# Patient Record
Sex: Female | Born: 1975
Health system: Southern US, Community
[De-identification: ages and names within clinical notes are randomized; demographics above are authoritative.]

## PROBLEM LIST (undated history)

## (undated) DIAGNOSIS — Z87442 Personal history of urinary calculi: Secondary | ICD-10-CM

## (undated) DIAGNOSIS — Z860100 Personal history of colon polyps, unspecified: Secondary | ICD-10-CM

## (undated) DIAGNOSIS — K859 Acute pancreatitis without necrosis or infection, unspecified: Secondary | ICD-10-CM

## (undated) DIAGNOSIS — E669 Obesity, unspecified: Secondary | ICD-10-CM

## (undated) DIAGNOSIS — G971 Other reaction to spinal and lumbar puncture: Secondary | ICD-10-CM

## (undated) DIAGNOSIS — G039 Meningitis, unspecified: Secondary | ICD-10-CM

## (undated) DIAGNOSIS — A6000 Herpesviral infection of urogenital system, unspecified: Secondary | ICD-10-CM

## (undated) DIAGNOSIS — F40243 Fear of flying: Secondary | ICD-10-CM

## (undated) HISTORY — DX: Personal history of colon polyps, unspecified: Z86.0100

## (undated) HISTORY — DX: Fear of flying: F40.243

## (undated) HISTORY — PX: OTHER SURGICAL HISTORY: SHX169

## (undated) HISTORY — DX: Personal history of urinary calculi: Z87.442

## (undated) HISTORY — DX: Other reaction to spinal and lumbar puncture: G97.1

## (undated) HISTORY — DX: Acute pancreatitis without necrosis or infection, unspecified: K85.90

## (undated) HISTORY — PX: TONSILLECTOMY: SUR1361

## (undated) HISTORY — PX: KNEE ARTHROSCOPY: SUR90

## (undated) HISTORY — DX: Herpesviral infection of urogenital system, unspecified: A60.00

## (undated) HISTORY — PX: ECTOPIC PREGNANCY SURGERY: SHX613

## (undated) HISTORY — PX: CHOLECYSTECTOMY: SHX55

## (undated) HISTORY — DX: Obesity, unspecified: E66.9

## (undated) HISTORY — PX: TUBAL LIGATION: SHX77

## (undated) HISTORY — DX: Personal history of colonic polyps: Z86.010

## (undated) HISTORY — DX: Meningitis, unspecified: G03.9

## (undated) HISTORY — PX: ABDOMINAL HYSTERECTOMY: SHX81

---

## 2008-10-16 ENCOUNTER — Emergency Department (HOSPITAL_BASED_OUTPATIENT_CLINIC_OR_DEPARTMENT_OTHER): Admission: EM | Admit: 2008-10-16 | Discharge: 2008-10-16 | Payer: Self-pay | Admitting: Emergency Medicine

## 2008-11-01 ENCOUNTER — Ambulatory Visit: Payer: Self-pay | Admitting: Internal Medicine

## 2008-11-01 LAB — CONVERTED CEMR LAB
HDL: 30.9 mg/dL — ABNORMAL LOW (ref >39.0)
TSH: 1.76 microintl units/mL (ref 0.35–5.50)

## 2008-11-16 ENCOUNTER — Ambulatory Visit: Payer: Self-pay | Admitting: Internal Medicine

## 2008-11-16 ENCOUNTER — Ambulatory Visit: Payer: Self-pay

## 2008-11-16 ENCOUNTER — Encounter: Payer: Self-pay | Admitting: Internal Medicine

## 2009-01-25 ENCOUNTER — Ambulatory Visit: Payer: Self-pay

## 2009-01-25 ENCOUNTER — Encounter: Payer: Self-pay | Admitting: Internal Medicine

## 2009-04-08 ENCOUNTER — Emergency Department (HOSPITAL_BASED_OUTPATIENT_CLINIC_OR_DEPARTMENT_OTHER): Admission: EM | Admit: 2009-04-08 | Discharge: 2009-04-08 | Payer: Self-pay | Admitting: Emergency Medicine

## 2009-04-08 ENCOUNTER — Ambulatory Visit: Payer: Self-pay | Admitting: Diagnostic Radiology

## 2009-04-10 ENCOUNTER — Emergency Department (HOSPITAL_BASED_OUTPATIENT_CLINIC_OR_DEPARTMENT_OTHER): Admission: EM | Admit: 2009-04-10 | Discharge: 2009-04-10 | Payer: Self-pay | Admitting: Emergency Medicine

## 2009-06-26 ENCOUNTER — Ambulatory Visit (HOSPITAL_COMMUNITY): Payer: Self-pay | Admitting: Psychiatry

## 2009-08-09 ENCOUNTER — Ambulatory Visit (HOSPITAL_COMMUNITY): Payer: Self-pay | Admitting: Psychiatry

## 2009-11-06 ENCOUNTER — Ambulatory Visit (HOSPITAL_COMMUNITY): Payer: Self-pay | Admitting: Psychiatry

## 2010-02-04 ENCOUNTER — Ambulatory Visit (HOSPITAL_COMMUNITY): Payer: Self-pay | Admitting: Psychiatry

## 2010-12-31 ENCOUNTER — Emergency Department (HOSPITAL_BASED_OUTPATIENT_CLINIC_OR_DEPARTMENT_OTHER)
Admission: EM | Admit: 2010-12-31 | Discharge: 2010-12-31 | Payer: Self-pay | Source: Home / Self Care | Admitting: Emergency Medicine

## 2010-12-31 LAB — COMPREHENSIVE METABOLIC PANEL
ALT: 30 U/L (ref 0–35)
AST: 24 U/L (ref 0–37)
Albumin: 4.4 g/dL (ref 3.5–5.2)
Alkaline Phosphatase: 109 U/L (ref 39–117)
BUN: 7 mg/dL (ref 6–23)
CO2: 21 mEq/L (ref 19–32)
Calcium: 9.2 mg/dL (ref 8.4–10.5)
Chloride: 109 mEq/L (ref 96–112)
Creatinine, Ser: 0.8 mg/dL (ref 0.4–1.2)
GFR calc Af Amer: >60 mL/min (ref >60)
GFR calc non Af Amer: >60 mL/min (ref >60)
Glucose, Bld: 89 mg/dL (ref 70–99)
Potassium: 4 mEq/L (ref 3.5–5.1)
Sodium: 142 mEq/L (ref 135–145)
Total Bilirubin: 0.7 mg/dL (ref 0.3–1.2)
Total Protein: 7.9 g/dL (ref 6.0–8.3)

## 2010-12-31 LAB — CBC
HCT: 40.4 % (ref 36.0–46.0)
Hemoglobin: 13.6 g/dL (ref 12.0–15.0)
MCH: 28.4 pg (ref 26.0–34.0)
MCHC: 33.7 g/dL (ref 30.0–36.0)
MCV: 84.3 fL (ref 78.0–100.0)
Platelets: 275 10*3/uL (ref 150–400)
RBC: 4.79 MIL/uL (ref 3.87–5.11)
RDW: 14.1 % (ref 11.5–15.5)
WBC: 9.7 10*3/uL (ref 4.0–10.5)

## 2010-12-31 LAB — WET PREP, GENITAL
Trich, Wet Prep: NONE SEEN
Yeast Wet Prep HPF POC: NONE SEEN

## 2010-12-31 LAB — URINALYSIS, ROUTINE W REFLEX MICROSCOPIC
Bilirubin Urine: NEGATIVE
Hemoglobin, Urine: NEGATIVE
Ketones, ur: 15 mg/dL — AB
Nitrite: NEGATIVE
Protein, ur: NEGATIVE mg/dL
Specific Gravity, Urine: 1.007 (ref 1.005–1.030)
Urine Glucose, Fasting: NEGATIVE mg/dL
Urobilinogen, UA: 0.2 mg/dL (ref 0.0–1.0)
pH: 5.5 (ref 5.0–8.0)

## 2010-12-31 LAB — PREGNANCY, URINE: Preg Test, Ur: NEGATIVE

## 2011-01-01 LAB — DIFFERENTIAL
Basophils Absolute: 0 10*3/uL (ref 0.0–0.1)
Basophils Relative: 0 % (ref 0–1)
Eosinophils Absolute: 0.1 10*3/uL (ref 0.0–0.7)
Eosinophils Relative: 1 % (ref 0–5)
Lymphocytes Relative: 26 % (ref 12–46)
Lymphs Abs: 2.5 10*3/uL (ref 0.7–4.0)
Monocytes Absolute: 0.5 10*3/uL (ref 0.1–1.0)
Monocytes Relative: 5 % (ref 3–12)
Neutro Abs: 6.6 10*3/uL (ref 1.7–7.7)
Neutrophils Relative %: 68 % (ref 43–77)

## 2011-01-01 LAB — GC/CHLAMYDIA PROBE AMP, GENITAL
Chlamydia, DNA Probe: NEGATIVE
GC Probe Amp, Genital: NEGATIVE

## 2011-04-08 LAB — COMPREHENSIVE METABOLIC PANEL
ALT: 51 U/L — ABNORMAL HIGH (ref 0–35)
AST: 32 U/L (ref 0–37)
Albumin: 4.2 g/dL (ref 3.5–5.2)
Alkaline Phosphatase: 97 U/L (ref 39–117)
BUN: 6 mg/dL (ref 6–23)
CO2: 27 mEq/L (ref 19–32)
Calcium: 9.3 mg/dL (ref 8.4–10.5)
Chloride: 104 mEq/L (ref 96–112)
Creatinine, Ser: 0.8 mg/dL (ref 0.4–1.2)
GFR calc Af Amer: >60 mL/min (ref >60)
GFR calc non Af Amer: >60 mL/min (ref >60)
Glucose, Bld: 88 mg/dL (ref 70–99)
Potassium: 3.7 mEq/L (ref 3.5–5.1)
Sodium: 141 mEq/L (ref 135–145)
Total Bilirubin: 0.7 mg/dL (ref 0.3–1.2)
Total Protein: 7.3 g/dL (ref 6.0–8.3)

## 2011-04-08 LAB — BASIC METABOLIC PANEL
BUN: 5 mg/dL — ABNORMAL LOW (ref 6–23)
CO2: 28 mEq/L (ref 19–32)
Calcium: 9.2 mg/dL (ref 8.4–10.5)
Chloride: 104 mEq/L (ref 96–112)
Creatinine, Ser: 0.9 mg/dL (ref 0.4–1.2)
GFR calc Af Amer: >60 mL/min (ref >60)
GFR calc non Af Amer: >60 mL/min (ref >60)
Glucose, Bld: 85 mg/dL (ref 70–99)
Potassium: 3.8 mEq/L (ref 3.5–5.1)
Sodium: 143 mEq/L (ref 135–145)

## 2011-04-08 LAB — URINALYSIS, ROUTINE W REFLEX MICROSCOPIC
Bilirubin Urine: NEGATIVE
Glucose, UA: NEGATIVE mg/dL
Hgb urine dipstick: NEGATIVE
Hgb urine dipstick: NEGATIVE
Ketones, ur: NEGATIVE mg/dL
Nitrite: NEGATIVE
Nitrite: NEGATIVE
Protein, ur: NEGATIVE mg/dL
Specific Gravity, Urine: 1.005 (ref 1.005–1.030)
Specific Gravity, Urine: 1.015 (ref 1.005–1.030)
Urobilinogen, UA: 0.2 mg/dL (ref 0.0–1.0)
Urobilinogen, UA: 0.2 mg/dL (ref 0.0–1.0)
pH: 5.5 (ref 5.0–8.0)

## 2011-04-08 LAB — CBC
HCT: 39.2 % (ref 36.0–46.0)
HCT: 40.1 % (ref 36.0–46.0)
Hemoglobin: 13.7 g/dL (ref 12.0–15.0)
Hemoglobin: 14.1 g/dL (ref 12.0–15.0)
MCHC: 34.9 g/dL (ref 30.0–36.0)
MCHC: 35.1 g/dL (ref 30.0–36.0)
MCV: 91.1 fL (ref 78.0–100.0)
MCV: 91.8 fL (ref 78.0–100.0)
Platelets: 219 10*3/uL (ref 150–400)
Platelets: 223 10*3/uL (ref 150–400)
RBC: 4.27 MIL/uL (ref 3.87–5.11)
RBC: 4.4 MIL/uL (ref 3.87–5.11)
RDW: 12.7 % (ref 11.5–15.5)
RDW: 12.9 % (ref 11.5–15.5)
WBC: 6.3 10*3/uL (ref 4.0–10.5)
WBC: 6.5 10*3/uL (ref 4.0–10.5)

## 2011-04-08 LAB — PREGNANCY, URINE
Preg Test, Ur: NEGATIVE
Preg Test, Ur: NEGATIVE

## 2011-04-08 LAB — URINE MICROSCOPIC-ADD ON

## 2011-04-08 LAB — DIFFERENTIAL
Basophils Absolute: 0 10*3/uL (ref 0.0–0.1)
Basophils Absolute: 0 10*3/uL (ref 0.0–0.1)
Basophils Relative: 0 % (ref 0–1)
Basophils Relative: 1 % (ref 0–1)
Eosinophils Absolute: 0 10*3/uL (ref 0.0–0.7)
Eosinophils Absolute: 0.1 10*3/uL (ref 0.0–0.7)
Eosinophils Relative: 1 % (ref 0–5)
Eosinophils Relative: 1 % (ref 0–5)
Lymphocytes Relative: 28 % (ref 12–46)
Lymphocytes Relative: 31 % (ref 12–46)
Lymphs Abs: 1.8 10*3/uL (ref 0.7–4.0)
Lymphs Abs: 2 10*3/uL (ref 0.7–4.0)
Monocytes Absolute: 0.4 10*3/uL (ref 0.1–1.0)
Monocytes Absolute: 0.4 10*3/uL (ref 0.1–1.0)
Monocytes Relative: 6 % (ref 3–12)
Monocytes Relative: 7 % (ref 3–12)
Neutro Abs: 3.8 10*3/uL (ref 1.7–7.7)
Neutro Abs: 4.3 10*3/uL (ref 1.7–7.7)
Neutrophils Relative %: 60 % (ref 43–77)
Neutrophils Relative %: 65 % (ref 43–77)

## 2011-04-08 LAB — WET PREP, GENITAL: Clue Cells Wet Prep HPF POC: NONE SEEN

## 2011-04-08 LAB — LIPASE, BLOOD: Lipase: 95 U/L (ref 23–300)

## 2011-05-12 NOTE — Assessment & Plan Note (Signed)
[facility]                            CARDIOLOGY OFFICE NOTE   Laurie [name]                     MRN:          [id number]  DATE:11/01/2008                            DOB:          1976/04/12    IDENTIFICATION:  Laurie [name] is a 35 year old woman who is referred  from the emergency room for continued evaluation of chest pain.   HISTORY OF PRESENT ILLNESS:  The patient developed chest pain on October 16, 2008.  She was seen in the [facility] Emergency Room.  She describes  the pain as lasting about 2 hours before she came to the emergency room,  it was sharp on the left sided, worse with inspiration and radiated down  her arm with numbness.  Note, also moving her right arm, made it worse.  She has not had previously.  Noted some mild shortness of breath with  this.   The patient underwent CT angiography, which was negative.  Blood work  was negative for cardiac damage and she was sent home.   Since being sent home, she notes only occasional episodes of pain, but  not like which she had had.  More problematic as she said she is short  of breath.  Activity, she can do 6 months ago, she is having a hard time  doing now.  She is just fatigued.  She does note that in September, she  had flu-like symptoms and seemed like she has not recovered.   ALLERGIES:  None.   CURRENT MEDICATIONS:  Oral contraceptives and Loestrin Fe.   PAST MEDICAL HISTORY:  Menorrhagia and seasonal allergies.   SOCIAL HISTORY:  The patient is married.  Quit tobacco in April 2009.  Smoking previously from age 20 a whole half every few days.  Drinks  about 3 beers per day.   FAMILY HISTORY:  Mother is 47 and has thyroid problems.  Father is 47,  health unknown.   REVIEW OF SYSTEMS:  All systems reviewed, negative for the above problem  except as noted above.   PHYSICAL EXAMINATION:  GENERAL:  The patient is in no acute distress.  VITAL SIGNS:  Blood pressure  130/86, pulse is 100, and weight 247.  HEENT:  Normocephalic and atraumatic.  EOMI, PERL.  Mucous membranes  moist.  NECK:  JVP is normal.  The thyroid is slightly prominent.  No bruits.  LUNGS:  Clear to auscultation.  No rales or wheezes.  CARDIAC:  Regular rate and rhythm, S1 and S2.  No S3, S4, or murmurs.  ABDOMEN:  Obese, benign.  No hepatomegaly.  Normal bowel sounds.  No  masses.  EXTREMITIES:  Good distal pulses throughout.  No lower extremity edema.   A 12-lead EKG from the emergency room showed normal sinus rhythm, 84  beats per minute, T-wave inversion in III, F, V1 through V4.  A 12-lead  EKG today shows sinus rhythm, 100 beats per minute again, T-wave  inversion noted in II, III, F, V1 through V6, and poor R-wave  progression.   IMPRESSION:  Laurie [name] is a 35 year old with chest pain, atypical for  coronary seems more muscular and pleuritic.  This has not been the  biggest problem now.  She has got resultant shortness of breath.  On  exam, I do not see any evidence for heart failure.  EKG is abnormal.  I  do not have an old EKG for comparison, however.   What I would recommend for now is:  1. Checking a TSH.  2. Setting up the patient for echocardiogram.  Note, she was not      anemic when she was in the emergency room.   If the labs were normal and echo was normal, I will set her up for a  stress test as she does have risk factors for coronary artery disease.  Her EKG is abnormal.   I will be in touch with the patient and were to proceed once I have seen  the test results.   ADDENDUM:  Counseled on alcohol to cut down to 1 drink or less per day.  I congratulated on smoking cessation.     Pricilla Riffle, MD, [facility]  Electronically Signed    PVR/MedQ  DD: 11/01/2008  DT: 11/02/2008  Job #: [id number]

## 2011-09-29 LAB — POCT I-STAT, CHEM 8
Chloride: 109
HCT: 41
Hemoglobin: 13.9
Potassium: 3.7
Sodium: 138

## 2011-09-29 LAB — POCT CARDIAC MARKERS
Myoglobin, poc: 32.9
Myoglobin, poc: 40.5
Troponin i, poc: <0.05

## 2011-11-26 ENCOUNTER — Emergency Department (INDEPENDENT_AMBULATORY_CARE_PROVIDER_SITE_OTHER): Payer: Managed Care, Other (non HMO)

## 2011-11-26 ENCOUNTER — Encounter: Payer: Self-pay | Admitting: *Deleted

## 2011-11-26 ENCOUNTER — Emergency Department (HOSPITAL_BASED_OUTPATIENT_CLINIC_OR_DEPARTMENT_OTHER)
Admission: EM | Admit: 2011-11-26 | Discharge: 2011-11-26 | Disposition: A | Discharge status: Home / Self Care | Payer: Managed Care, Other (non HMO) | Attending: Emergency Medicine | Admitting: Emergency Medicine

## 2011-11-26 DIAGNOSIS — R51 Headache: Secondary | ICD-10-CM

## 2011-11-26 DIAGNOSIS — A879 Viral meningitis, unspecified: Secondary | ICD-10-CM | POA: Insufficient documentation

## 2011-11-26 DIAGNOSIS — R079 Chest pain, unspecified: Secondary | ICD-10-CM

## 2011-11-26 DIAGNOSIS — R509 Fever, unspecified: Secondary | ICD-10-CM

## 2011-11-26 LAB — BASIC METABOLIC PANEL
BUN: 11 mg/dL (ref 6–23)
CO2: 27 mEq/L (ref 19–32)
Calcium: 9.6 mg/dL (ref 8.4–10.5)
Chloride: 100 mEq/L (ref 96–112)
Creatinine, Ser: 0.8 mg/dL (ref 0.50–1.10)
GFR calc Af Amer: >90 mL/min (ref >90)
GFR calc non Af Amer: >90 mL/min (ref >90)
Glucose, Bld: 96 mg/dL (ref 70–99)
Potassium: 3.9 mEq/L (ref 3.5–5.1)
Sodium: 137 mEq/L (ref 135–145)

## 2011-11-26 LAB — DIFFERENTIAL
Eosinophils Absolute: 0.1 10*3/uL (ref 0.0–0.7)
Eosinophils Relative: 2 % (ref 0–5)
Lymphocytes Relative: 10 % — ABNORMAL LOW (ref 12–46)
Lymphs Abs: 0.7 10*3/uL (ref 0.7–4.0)
Monocytes Relative: 9 % (ref 3–12)
Neutrophils Relative %: 79 % — ABNORMAL HIGH (ref 43–77)

## 2011-11-26 LAB — CSF CELL COUNT WITH DIFFERENTIAL
RBC Count, CSF: 3 /mm3 — ABNORMAL HIGH
Tube #: 4
WBC, CSF: 1 /mm3 (ref 0–5)

## 2011-11-26 LAB — CBC
HCT: 41.8 % (ref 36.0–46.0)
Hemoglobin: 13.8 g/dL (ref 12.0–15.0)
MCH: 28.8 pg (ref 26.0–34.0)
MCHC: 33 g/dL (ref 30.0–36.0)
MCV: 87.1 fL (ref 78.0–100.0)
Platelets: 190 10*3/uL (ref 150–400)
RBC: 4.8 MIL/uL (ref 3.87–5.11)
RDW: 14.8 % (ref 11.5–15.5)
WBC: 6.7 10*3/uL (ref 4.0–10.5)

## 2011-11-26 LAB — URINALYSIS, ROUTINE W REFLEX MICROSCOPIC
Bilirubin Urine: NEGATIVE
Glucose, UA: NEGATIVE mg/dL
Hgb urine dipstick: NEGATIVE
Ketones, ur: 15 mg/dL — AB
Leukocytes, UA: NEGATIVE
Nitrite: NEGATIVE
Protein, ur: NEGATIVE mg/dL
Specific Gravity, Urine: 1.014 (ref 1.005–1.030)
Urobilinogen, UA: 0.2 mg/dL (ref 0.0–1.0)
pH: 7 (ref 5.0–8.0)

## 2011-11-26 LAB — PROTEIN AND GLUCOSE, CSF
Glucose, CSF: 61 mg/dL (ref 43–76)
Total  Protein, CSF: 24 mg/dL (ref 15–45)

## 2011-11-26 MED ORDER — CEFTRIAXONE SODIUM 2 G IJ SOLR
2.0000 g | Freq: Once | INTRAMUSCULAR | Status: AC
  Administered 2011-11-26: 2 g via INTRAVENOUS
  Filled 2011-11-26: qty 2

## 2011-11-26 MED ORDER — SODIUM CHLORIDE 0.9 % IV BOLUS (SEPSIS)
1000.0000 mL | Freq: Once | INTRAVENOUS | Status: AC
  Administered 2011-11-26: 1000 mL via INTRAVENOUS

## 2011-11-26 MED ORDER — LIDOCAINE-EPINEPHRINE 2 %-1:100000 IJ SOLN
INTRAMUSCULAR | Status: AC
  Filled 2011-11-26: qty 1

## 2011-11-26 MED ORDER — MORPHINE SULFATE 2 MG/ML IJ SOLN
2.0000 mg | Freq: Once | INTRAMUSCULAR | Status: AC
  Administered 2011-11-26: 2 mg via INTRAVENOUS
  Filled 2011-11-26: qty 1

## 2011-11-26 MED ORDER — ONDANSETRON HCL 4 MG/2ML IJ SOLN
4.0000 mg | Freq: Once | INTRAMUSCULAR | Status: AC
  Administered 2011-11-26: 4 mg via INTRAVENOUS
  Filled 2011-11-26: qty 2

## 2011-11-26 MED ORDER — HYDROMORPHONE HCL PF 2 MG/ML IJ SOLN
2.0000 mg | Freq: Once | INTRAMUSCULAR | Status: AC
  Administered 2011-11-26: 2 mg via INTRAVENOUS

## 2011-11-26 MED ORDER — OXYCODONE-ACETAMINOPHEN 5-325 MG PO TABS: 12.0000 | ORAL_TABLET | ORAL | Status: DC | PRN

## 2011-11-26 MED ORDER — LIDOCAINE-EPINEPHRINE 2 %-1:200000 IJ SOLN
20.0000 mL | Freq: Once | INTRAMUSCULAR | Status: AC
Start: 2011-11-26 — End: 2011-11-26
  Administered 2011-11-26: 20 mL

## 2011-11-26 MED ORDER — ONDANSETRON 8 MG PO TBDP: 8.0000 mg | ORAL_TABLET | Freq: Three times a day (TID) | ORAL | Status: AC | PRN

## 2011-11-26 MED ORDER — HYDROMORPHONE HCL PF 2 MG/ML IJ SOLN
INTRAMUSCULAR | Status: AC
  Administered 2011-11-26: 2 mg via INTRAVENOUS
  Filled 2011-11-26: qty 1

## 2011-11-26 NOTE — ED Provider Notes (Signed)
Signed out that if/when csf returns, and is not worrisome for bact men, that d/c instructions complete, to d/c home. Csf not c/w bact men. Recheck pt comfortable. Able to move head/neck freely without apparent pain or rigidity.   Suzi Roots, MD 11/26/11 [phone]

## 2011-11-26 NOTE — ED Notes (Signed)
Patient states on Tuesday evening with a headache.  Yesterday started to have a fever, cough, sorethroat and confusion.  Taking tylenol flu meds with minimal relief.  Was seen this morning by [facility] PCP and sent here to r/o meninginitis to r/o neck stiffness, photophobia and headache.  States she had a negative flu test.

## 2011-11-26 NOTE — ED Notes (Signed)
Pt drinking diet coke and tolerating well.

## 2011-11-26 NOTE — ED Provider Notes (Signed)
History     CSN: [id number] Arrival date & time: 11/26/2011 10:39 AM   First MD Initiated Contact with Patient 11/26/11 1110      Chief Complaint  Patient presents with  . Headache    PCP sent patient here to r/o meningitis    (Consider location/radiation/quality/duration/timing/severity/associated sxs/prior treatment) HPI  History reviewed. No pertinent past medical history.  Patient with headache began Tuesday resolved then returned yesterday.  Myalgias, fever, neck pain and spinal cord feels "hot."  Patient states seeing colors differently.  Patient states fever has been to 102.  Took tylenol at 830.  Saw her doctor at 0900.  Patient sent here for evaluation.  Pain is 6/10.  Pain is located bifrontal and back and down neck.  Patient denies sick exposures.  No flu shot but negative flu swab at her doctor's office.    Past Surgical History  Procedure Date  . Abdominal hysterectomy   . Tonsillectomy   . Cholecystectomy   . Bilateral tubal      x2  . Ectopic pregnancy surgery     No family history on file.  History  Substance Use Topics  . Smoking status: Former Games developer  . Smokeless tobacco: Not on file  . Alcohol Use: 0.0 oz/week    2-3 Cans of beer per week     daily    OB History    Grav Para Term Preterm Abortions TAB SAB Ect Mult Living                  Review of Systems  All other systems reviewed and are negative.    Allergies  Review of patient's allergies indicates no known allergies.  Home Medications   Current Outpatient Rx  Name Route Sig Dispense Refill  . ESTRADIOL 0.05 MG/24HR TD PTTW Transdermal Place 1 patch onto the skin once a week.      Marland Kitchen ESTRADIOL 0.1 MG/24HR TD PTTW Transdermal Place 1 patch onto the skin 2 (two) times a week.        BP 132/92  Pulse 125  Temp(Src) 98.3 F (36.8 C) (Oral)  Resp 20  Ht 5\' 11"  (1.803 m)  Wt 207 lb (93.895 kg)  BMI 28.87 kg/m2  SpO2 100%  Physical Exam  Nursing note and vitals  reviewed. Constitutional: She appears well-developed and well-nourished.  HENT:  Head: Normocephalic and atraumatic.  Nose: Nose normal.  Mouth/Throat: Oropharynx is clear and moist.  Eyes: Conjunctivae and EOM are normal. Pupils are equal, round, and reactive to light.  Neck: Normal range of motion. Neck supple.  Cardiovascular: Tachycardia present.   Pulmonary/Chest: Breath sounds normal. She is in respiratory distress.    ED Course  LUMBAR PUNCTURE Date/Time: 11/26/2011 3:59 PM Performed by: Hilario Quarry Authorized by: Hilario Quarry Consent: Verbal consent obtained. Written consent obtained. Risks and benefits: risks, benefits and alternatives were discussed Consent given by: patient Patient understanding: patient states understanding of the procedure being performed Patient identity confirmed: verbally with patient Time out: Immediately prior to procedure a "time out" was called to verify the correct patient, procedure, equipment, support staff and site/side marked as required. Indications: evaluation for infection Anesthesia: local infiltration Local anesthetic: lidocaine 1% with epinephrine Patient sedated: no Preparation: Patient was prepped and draped in the usual sterile fashion. Lumbar space: L4-L5 interspace Patient's position: left lateral decubitus Needle gauge: 22 Needle type: spinal needle - Quincke tip Needle length: 3.5 in Number of attempts: 1 Fluid appearance: clear Tubes of fluid:  4 Total volume: 4 ml Post-procedure: site cleaned Patient tolerance: Patient tolerated the procedure well with no immediate complications.   (including critical care time)  Labs Reviewed - No data to display No results found.   No diagnosis found.    MDM          Hilario Quarry, MD 11/27/11 [phone]

## 2011-11-30 ENCOUNTER — Encounter (HOSPITAL_BASED_OUTPATIENT_CLINIC_OR_DEPARTMENT_OTHER): Payer: Self-pay | Admitting: *Deleted

## 2011-11-30 ENCOUNTER — Emergency Department (HOSPITAL_BASED_OUTPATIENT_CLINIC_OR_DEPARTMENT_OTHER)
Admission: EM | Admit: 2011-11-30 | Discharge: 2011-11-30 | Disposition: A | Discharge status: Home / Self Care | Payer: Managed Care, Other (non HMO) | Attending: Emergency Medicine | Admitting: Emergency Medicine

## 2011-11-30 DIAGNOSIS — G971 Other reaction to spinal and lumbar puncture: Secondary | ICD-10-CM | POA: Insufficient documentation

## 2011-11-30 DIAGNOSIS — R110 Nausea: Secondary | ICD-10-CM | POA: Insufficient documentation

## 2011-11-30 DIAGNOSIS — H53149 Visual discomfort, unspecified: Secondary | ICD-10-CM | POA: Insufficient documentation

## 2011-11-30 LAB — CSF CULTURE W GRAM STAIN: Culture: NO GROWTH

## 2011-11-30 MED ORDER — KETOROLAC TROMETHAMINE 30 MG/ML IJ SOLN
30.0000 mg | Freq: Once | INTRAMUSCULAR | Status: AC
  Administered 2011-11-30: 30 mg via INTRAVENOUS
  Filled 2011-11-30: qty 1

## 2011-11-30 MED ORDER — DIPHENHYDRAMINE HCL 50 MG/ML IJ SOLN
25.0000 mg | Freq: Once | INTRAMUSCULAR | Status: AC
  Administered 2011-11-30: 25 mg via INTRAVENOUS
  Filled 2011-11-30: qty 1

## 2011-11-30 MED ORDER — SODIUM CHLORIDE 0.9 % IV BOLUS (SEPSIS)
2000.0000 mL | Freq: Once | INTRAVENOUS | Status: AC
  Administered 2011-11-30: 2000 mL via INTRAVENOUS

## 2011-11-30 MED ORDER — METHYLPREDNISOLONE SODIUM SUCC 125 MG IJ SOLR
125.0000 mg | Freq: Once | INTRAMUSCULAR | Status: AC
  Administered 2011-11-30: 125 mg via INTRAVENOUS
  Filled 2011-11-30: qty 2

## 2011-11-30 MED ORDER — OXYCODONE-ACETAMINOPHEN 5-325 MG PO TABS: 2.0000 | ORAL_TABLET | ORAL | Status: DC | PRN

## 2011-11-30 NOTE — ED Notes (Signed)
In to assess pt lying in dark flat family at the bedside explained delay to patient assured her she will be seen soon

## 2011-11-30 NOTE — ED Provider Notes (Signed)
History     CSN: [id number] Arrival date & time: 11/30/2011 12:15 PM   First MD Initiated Contact with Patient 11/30/11 1314      Chief Complaint  Patient presents with  . Headache  . Photophobia  . Nausea    (Consider location/radiation/quality/duration/timing/severity/associated sxs/prior treatment) HPI  Patient seen here with ifl symptoms on Thursday and had lp negative for bacterial meningitis.  Fever stopped Friday morning.  Other symptoms resolving.  Headache continues.  Worse with standing and sitting.    History reviewed. No pertinent past medical history.  Past Surgical History  Procedure Date  . Abdominal hysterectomy   . Tonsillectomy   . Cholecystectomy   . Bilateral tubal      x2  . Ectopic pregnancy surgery     History reviewed. No pertinent family history.  History  Substance Use Topics  . Smoking status: Former Games developer  . Smokeless tobacco: Not on file  . Alcohol Use: 0.0 oz/week    2-3 Cans of beer per week     daily    OB History    Grav Para Term Preterm Abortions TAB SAB Ect Mult Living                  Review of Systems  All other systems reviewed and are negative.    Allergies  Review of patient's allergies indicates no known allergies.  Home Medications   Current Outpatient Rx  Name Route Sig Dispense Refill  . ESTRADIOL 0.05 MG/24HR TD PTTW Transdermal Place 1 patch onto the skin once a week.      Marland Kitchen ESTRADIOL 0.1 MG/24HR TD PTTW Transdermal Place 1 patch onto the skin 2 (two) times a week.      Marland Kitchen ONDANSETRON 8 MG PO TBDP Oral Take 1 tablet (8 mg total) by mouth every 8 (eight) hours as needed for nausea. 20 tablet 0  . OXYCODONE-ACETAMINOPHEN 5-325 MG PO TABS Oral Take 12 tablets by mouth every 4 (four) hours as needed for pain. 6 tablet 0    BP 128/88  Resp 20  SpO2 100%  Physical Exam  Nursing note and vitals reviewed. Constitutional: She is oriented to person, place, and time. She appears well-developed.  HENT:    Head: Normocephalic and atraumatic.  Right Ear: External ear normal.  Left Ear: External ear normal.  Nose: Nose normal.  Mouth/Throat: Oropharynx is clear and moist.  Eyes: Conjunctivae and EOM are normal. Pupils are equal, round, and reactive to light.  Neck: Normal range of motion. Neck supple.  Cardiovascular: Normal rate and regular rhythm.   Pulmonary/Chest: Effort normal and breath sounds normal.  Abdominal: Soft. Bowel sounds are normal.  Musculoskeletal: Normal range of motion.  Neurological: She is alert and oriented to person, place, and time. She has normal reflexes.  Skin: Skin is warm.  Psychiatric: She has a normal mood and affect. Her behavior is normal.    ED Course  Procedures (including critical care time)  Labs Reviewed - No data to display No results found.   No diagnosis found.    MDM  Patient with headache improved.          Hilario Quarry, MD 11/30/11 [phone]

## 2011-11-30 NOTE — ED Notes (Signed)
Had lumbar puncture here on Thursday starting on Friday she started having severe headaches and photophobia sitting up or standing up if lying flat only has dull headache

## 2011-12-02 ENCOUNTER — Encounter (HOSPITAL_BASED_OUTPATIENT_CLINIC_OR_DEPARTMENT_OTHER): Payer: Self-pay | Admitting: Family Medicine

## 2011-12-02 ENCOUNTER — Observation Stay (HOSPITAL_BASED_OUTPATIENT_CLINIC_OR_DEPARTMENT_OTHER)
Admission: EM | Admit: 2011-12-02 | Discharge: 2011-12-04 | Disposition: A | Discharge status: Home / Self Care | Payer: Managed Care, Other (non HMO) | Attending: Internal Medicine | Admitting: Internal Medicine

## 2011-12-02 DIAGNOSIS — G971 Other reaction to spinal and lumbar puncture: Secondary | ICD-10-CM

## 2011-12-02 DIAGNOSIS — H53149 Visual discomfort, unspecified: Secondary | ICD-10-CM | POA: Insufficient documentation

## 2011-12-02 DIAGNOSIS — Y844 Aspiration of fluid as the cause of abnormal reaction of the patient, or of later complication, without mention of misadventure at the time of the procedure: Secondary | ICD-10-CM | POA: Insufficient documentation

## 2011-12-02 HISTORY — DX: Other reaction to spinal and lumbar puncture: G97.1

## 2011-12-02 LAB — BASIC METABOLIC PANEL
CO2: 27 mEq/L (ref 19–32)
Calcium: 9 mg/dL (ref 8.4–10.5)
Chloride: 108 mEq/L (ref 96–112)
Creatinine, Ser: 0.8 mg/dL (ref 0.50–1.10)
Glucose, Bld: 81 mg/dL (ref 70–99)

## 2011-12-02 LAB — PREGNANCY, URINE: Preg Test, Ur: NEGATIVE

## 2011-12-02 LAB — CBC
HCT: 37.7 % (ref 36.0–46.0)
Hemoglobin: 12.6 g/dL (ref 12.0–15.0)
MCH: 28.8 pg (ref 26.0–34.0)
MCV: 86.3 fL (ref 78.0–100.0)
Platelets: 207 10*3/uL (ref 150–400)
RBC: 4.37 MIL/uL (ref 3.87–5.11)
WBC: 10.8 10*3/uL — ABNORMAL HIGH (ref 4.0–10.5)

## 2011-12-02 MED ORDER — METOCLOPRAMIDE HCL 5 MG/ML IJ SOLN
INTRAMUSCULAR | Status: AC
  Administered 2011-12-02: 10 mg via INTRAVENOUS
  Filled 2011-12-02: qty 2

## 2011-12-02 MED ORDER — DIPHENHYDRAMINE HCL 50 MG/ML IJ SOLN
INTRAMUSCULAR | Status: AC
  Administered 2011-12-02: 25 mg via INTRAVENOUS
  Filled 2011-12-02: qty 1

## 2011-12-02 MED ORDER — ESTRADIOL 0.05 MG/24HR TD PTWK
0.0500 mg | MEDICATED_PATCH | TRANSDERMAL | Status: DC
  Filled 2011-12-02: qty 1

## 2011-12-02 MED ORDER — ZOLPIDEM TARTRATE 5 MG PO TABS: 5.0000 mg | ORAL_TABLET | Freq: Every evening | ORAL | Status: DC | PRN

## 2011-12-02 MED ORDER — METOCLOPRAMIDE HCL 5 MG/ML IJ SOLN
10.0000 mg | Freq: Once | INTRAMUSCULAR | Status: DC
Start: 2011-12-02 — End: 2011-12-02
  Administered 2011-12-02: 10 mg via INTRAVENOUS

## 2011-12-02 MED ORDER — KETOROLAC TROMETHAMINE 30 MG/ML IJ SOLN: 30.0000 mg | Freq: Four times a day (QID) | INTRAMUSCULAR | Status: DC | PRN

## 2011-12-02 MED ORDER — SODIUM CHLORIDE 0.9 % IV SOLN
INTRAVENOUS | Status: DC
  Administered 2011-12-02: 23:00:00 via INTRAVENOUS

## 2011-12-02 MED ORDER — KETOROLAC TROMETHAMINE 30 MG/ML IJ SOLN
30.0000 mg | Freq: Once | INTRAMUSCULAR | Status: DC
Start: 2011-12-02 — End: 2011-12-02
  Administered 2011-12-02: 30 mg via INTRAVENOUS

## 2011-12-02 MED ORDER — OXYCODONE-ACETAMINOPHEN 5-325 MG PO TABS
2.0000 | ORAL_TABLET | ORAL | Status: DC | PRN
  Administered 2011-12-02 – 2011-12-04 (×6): 2 via ORAL
  Filled 2011-12-02 (×6): qty 2

## 2011-12-02 MED ORDER — DIPHENHYDRAMINE HCL 50 MG/ML IJ SOLN
25.0000 mg | Freq: Once | INTRAMUSCULAR | Status: DC
  Administered 2011-12-02: 25 mg via INTRAVENOUS

## 2011-12-02 MED ORDER — MORPHINE SULFATE 2 MG/ML IJ SOLN
2.0000 mg | INTRAMUSCULAR | Status: DC | PRN
  Administered 2011-12-03: 2 mg via INTRAVENOUS
  Filled 2011-12-02: qty 1

## 2011-12-02 MED ORDER — ACETAMINOPHEN 325 MG PO TABS: 650.0000 mg | ORAL_TABLET | Freq: Four times a day (QID) | ORAL | Status: DC | PRN

## 2011-12-02 MED ORDER — ONDANSETRON HCL 4 MG/2ML IJ SOLN
4.0000 mg | Freq: Three times a day (TID) | INTRAMUSCULAR | Status: AC | PRN
  Administered 2011-12-03: 4 mg via INTRAVENOUS
  Filled 2011-12-02: qty 2

## 2011-12-02 MED ORDER — KETOROLAC TROMETHAMINE 30 MG/ML IJ SOLN
INTRAMUSCULAR | Status: AC
  Administered 2011-12-02: 30 mg via INTRAVENOUS
  Filled 2011-12-02: qty 1

## 2011-12-02 MED ORDER — DOCUSATE SODIUM 100 MG PO CAPS: 100.0000 mg | ORAL_CAPSULE | Freq: Two times a day (BID) | ORAL | Status: DC | PRN

## 2011-12-02 NOTE — ED Notes (Signed)
Patient not experiencing any nausea at this time, no c/o pain

## 2011-12-02 NOTE — ED Provider Notes (Signed)
History     CSN: [id number] Arrival date & time: 12/02/2011  2:29 PM   None     Chief Complaint  Patient presents with  . Headache    (Consider location/radiation/quality/duration/timing/severity/associated sxs/prior treatment) HPI  35 year old with noncontributory PMH who presented to ED 6 days ago with headache beginning two days before.  Was associated with myalgias, fever, neck pain and spinal cord feeling "hot" per ED records from that day.  Patient also reported seeing colors differently.  Had seen her outpatient doc and had a negative flu test, and was recommended she go to the ED that day for evaluation for meningitis.  Lumbar puncture / CSF analysis was normal, and CT head was normal. Of note opening pressure was not recorded on LP.  Patient presented again two days ago with worse headache, mainly occipital and back of neck.  Was given migraine cocktail, and headache improved.  However, the relief lasted only two hours.   Since then, she has had continued occipital and back of neck headache with associated photophobia.  Headache was particularly bad today and she had some vomiting that started at 1pm.  "Pain is 1000x worse with sitting up or standing".  Pain is 10/10 currently.    No confusion.  No CP, SOB, abd pain, dysuria, constip, diarrhea.   History reviewed. No pertinent past medical history.  Past Surgical History  Procedure Date  . Abdominal hysterectomy   . Tonsillectomy   . Cholecystectomy   . Bilateral tubal      x2  . Ectopic pregnancy surgery     No family history on file.  History  Substance Use Topics  . Smoking status: Former Games developer  . Smokeless tobacco: Not on file  . Alcohol Use: 0.0 oz/week    2-3 Cans of beer per week     daily    Review of Systems No confusion.  No CP, SOB, abd pain, dysuria, constip, diarrhea.  Allergies  Review of patient's allergies indicates no known allergies.  Home Medications   Current Outpatient Rx  Name  Route Sig Dispense Refill  . ESTRADIOL 0.05 MG/24HR TD PTTW Transdermal Place 1 patch onto the skin once a week.      Marland Kitchen ESTRADIOL 0.1 MG/24HR TD PTTW Transdermal Place 1 patch onto the skin 2 (two) times a week.      Marland Kitchen ONDANSETRON 8 MG PO TBDP Oral Take 1 tablet (8 mg total) by mouth every 8 (eight) hours as needed for nausea. 20 tablet 0  . OXYCODONE-ACETAMINOPHEN 5-325 MG PO TABS Oral Take 12 tablets by mouth every 4 (four) hours as needed for pain. 6 tablet 0  . OXYCODONE-ACETAMINOPHEN 5-325 MG PO TABS Oral Take 2 tablets by mouth every 4 (four) hours as needed for pain. 6 tablet 0    BP 136/103  Pulse 85  Temp(Src) 98.3 F (36.8 C) (Oral)  Resp 18  SpO2 100%  Physical Exam  General: alert, well-developed, and cooperative to examination.  Head: normocephalic and atraumatic.  Eyes: vision grossly intact, pupils equal, pupils round, pupils reactive to light, no injection and anicteric.  Mouth: pharynx pink and moist, no erythema, and no exudates.   Neck: Tenderness on posterior neck and occipital area.  Neck movement makes the headache in these areas a little worse but not drastically worse.  Straight leg raise does not increase HA.  With knees up lifting head does not increase HA.  No JVD.  Lungs: normal respiratory effort, no accessory muscle  use, normal breath sounds, no crackles, and no wheezes. Heart: normal rate, regular rhythm, no murmur, no gallop, and no rub.   Neurologic: alert & oriented X3, cranial nerves II-XII intact, strength normal in all extremities.  Patellar reflexes normal.       ED Course  Procedures (including critical care time)  Toradol, Reglan, and Benadryl given.  Patient experienced some relief from these meds.  CT Head from 11/26/11 *RADIOLOGY REPORT*  Clinical Data: Headache, fever.  CT HEAD WITHOUT CONTRAST  Technique: Contiguous axial images were obtained from the base of  the skull through the vertex without contrast.  Comparison: None    Findings: No acute intracranial abnormality. Specifically, no  hemorrhage, hydrocephalus, mass lesion, acute infarction, or  significant intracranial injury. No acute calvarial abnormality.  Visualized paranasal sinuses and mastoids clear. Orbital soft  tissues unremarkable.  IMPRESSION:  No acute intracranial abnormality.  Diagnosis: Post-Lumbar Puncture Headache   Spoke with neurologist on call Dr. Lyman Speller.  His recommendation was to admit to internal medicine service at [facility] to get MRI/MRA brain with MRV and likely blood patch.      MDM   I discussed all aspects of this case with my attending Dr. Patrica Duel.  He agreed with the assessment and plan.         Blanca Friend, MD 12/02/11 1819

## 2011-12-02 NOTE — ED Notes (Signed)
Heather from [place] called for report. En route.

## 2011-12-02 NOTE — ED Notes (Signed)
In to draw blood work, pt and wife with ?s r/t possible transfer-Ben Wainright,MD notified-pt states unable to void at present for upreg

## 2011-12-02 NOTE — H&P (Signed)
PCP:   No primary provider on file.   Chief Complaint:  persistent headache after lumbar puncture.  HPI: Mrs Cuttino had a lumbar puncture on November 29th, after she presented with viral like illness with fever and headache, with concern for meningitis at that time. Meningitis was ruled out after lumbar puncture. She went home but has had occipital  headaches associated with photophobia and neck pain, especially when she sits up or stands up from a supine position. Headaches are better when she is lying down. She had relief with ketorolac etc, given at [facility]. There was concern for spinal headache. Dr Lyman Speller was consulted over the phone by the ED at [facility] and he recommended bringing the patient in for MRI and possible blood patch. Patient says the vomiting in the afternoon was associated with nausea and was non projectile. She says the fever is gone. Denies cough.  Review of Systems:  The patient denies anorexia, fever, weight loss,, vision loss, decreased hearing, hoarseness, chest pain, syncope, dyspnea on exertion, peripheral edema, balance deficits, hemoptysis, abdominal pain, melena, hematochezia, severe indigestion/heartburn, hematuria, incontinence, genital sores, muscle weakness, suspicious skin lesions, transient blindness, difficulty walking, depression, unusual weight change, abnormal bleeding, enlarged lymph nodes, angioedema, and breast masses.  Past Medical History: History reviewed. No pertinent past medical history. Past Surgical History  Procedure Date  . Abdominal hysterectomy   . Tonsillectomy   . Cholecystectomy   . Bilateral tubal      x2  . Ectopic pregnancy surgery     Medications: Prior to Admission medications   Medication Sig Start Date End Date Taking? Authorizing Provider  acetaminophen (TYLENOL) 500 MG tablet Take 500 mg by mouth every 6 (six) hours as needed. For pain    Yes Historical Provider, MD  Aspirin-Caffeine (BAYER BACK & BODY PAIN  EX ST) 500-32.5 MG TABS Take 2 tablets by mouth every 4 (four) hours as needed. For pain    Yes Historical Provider, MD  estradiol (VIVELLE-DOT) 0.1 MG/24HR Place 1 patch onto the skin 2 (two) times a week. Replace on Tuesday and Saturday   Yes Historical Provider, MD  ibuprofen (ADVIL,MOTRIN) 200 MG tablet Take 800 mg by mouth every 6 (six) hours as needed. For pain    Yes Historical Provider, MD  ondansetron (ZOFRAN ODT) 8 MG disintegrating tablet Take 1 tablet (8 mg total) by mouth every 8 (eight) hours as needed for nausea. 11/26/11 12/03/11 Yes Hilario Quarry, MD  oxyCODONE-acetaminophen (PERCOCET) 5-325 MG per tablet Take 1-2 tablets by mouth every 4 (four) hours as needed. For pain  11/26/11 12/06/11 Yes Hilario Quarry, MD    Allergies:  No Known Allergies  Social History:  reports that she has quit smoking. She does not have any smokeless tobacco history on file. She reports that she drinks alcohol. She reports that she does not use illicit drugs.  Family History: No family history on file.  Physical Exam: Filed Vitals:   12/02/11 1423 12/02/11 1831 12/02/11 1918 12/02/11 2055  BP: 136/103 122/82 121/80 134/87  Pulse: 85 59 66 60  Temp: 98.3 F (36.8 C)   98.8 F (37.1 C)  TempSrc: Oral   Oral  Resp: 18 19 18 18   Height:    5\' 11"  (1.803 m)  Weight:    114.3 kg (251 lb 15.8 oz)  SpO2: 100% 100% 100% 98%   General appearance: alert, cooperative and appears stated age Head: Normocephalic, without obvious abnormality, atraumatic Eyes: conjunctivae/corneas clear. PERRL, EOM's intact.  Fundi benign. Throat: lips, mucosa, and tongue normal; teeth and gums normal Neck: no adenopathy, no carotid bruit, no JVD, supple, symmetrical, trachea midline and thyroid not enlarged, symmetric, no tenderness/mass/nodules Resp: clear to auscultation bilaterally Cardio: regular rate and rhythm, S1, S2 normal, no murmur, click, rub or gallop Extremities: extremities normal, atraumatic, no cyanosis  or edema Pulses: 2+ and symmetric Neurologic: Grossly normal Kernig sign negative. No neck stiffness.   Labs on Admission:   [facility] 12/02/11 1755  NA 141  K 3.8  CL 108  CO2 27  GLUCOSE 81  BUN 15  CREATININE 0.80  CALCIUM 9.0  MG --  PHOS --   No results found for this basename: AST:2,ALT:2,ALKPHOS:2,BILITOT:2,PROT:2,ALBUMIN:2 in the last 72 hours No results found for this basename: LIPASE:2,AMYLASE:2 in the last 72 hours  Basename 12/02/11 1755  WBC 10.8*  NEUTROABS --  HGB 12.6  HCT 37.7  MCV 86.3  PLT 207   No results found for this basename: CKTOTAL:3,CKMB:3,CKMBINDEX:3,TROPONINI:3 in the last 72 hours No results found for this basename: TSH,T4TOTAL,FREET3,T3FREE,THYROIDAB in the last 72 hours No results found for this basename: VITAMINB12:2,FOLATE:2,FERRITIN:2,TIBC:2,IRON:2,RETICCTPCT:2 in the last 72 hours  Radiological Exams on Admission: Dg Chest 2 View  11/26/2011  *RADIOLOGY REPORT*  Clinical Data: Fever.  CHEST - 2 VIEW  Comparison: 10/16/2008  Findings: Heart and mediastinal contours are within normal limits. No focal opacities or effusions.  No acute bony abnormality.  IMPRESSION: Normal study.  Original Report Authenticated By: Cyndie Chime, M.D.   Ct Head Wo Contrast  11/26/2011  *RADIOLOGY REPORT*  Clinical Data: Headache, fever.  CT HEAD WITHOUT CONTRAST  Technique:  Contiguous axial images were obtained from the base of the skull through the vertex without contrast.  Comparison: None  Findings: No acute intracranial abnormality.  Specifically, no hemorrhage, hydrocephalus, mass lesion, acute infarction, or significant intracranial injury.  No acute calvarial abnormality. Visualized paranasal sinuses and mastoids clear.  Orbital soft tissues unremarkable.  IMPRESSION: No acute intracranial abnormality.  Original Report Authenticated By: Cyndie Chime, M.D.    Assessment 35 year old female who had viral like illness a few days ago, had lumbar  puncture as there was suspicion for meningitis. Now has likely spinal headache. Plan .Spinal headache- brain mri, analgesics. If mri negative-?blood patch. Consider neuro consult. Marland KitchenURTI (acute upper respiratory infection)- seems resolving. . DVT prophylaxis-scds/ambulate. Discussed plan of care with patient,husband and son. Condition fair.  Tonnette Zwiebel,[id number] 12/02/2011, 10:06 PM

## 2011-12-02 NOTE — ED Notes (Addendum)
Pt reports being seen for headache and having CT and spinal tap last Thursday. Pt sts h/a relieved on Monday and returned with vomiting and dizziness today. Pt also c/o left ear pain since Monday.

## 2011-12-02 NOTE — ED Notes (Signed)
Report received from [facility]. Assuming care of patient at this time.

## 2011-12-02 NOTE — ED Notes (Signed)
Report given to [facility] on 3000. Bed 3016.

## 2011-12-03 ENCOUNTER — Inpatient Hospital Stay (HOSPITAL_COMMUNITY): Payer: Managed Care, Other (non HMO)

## 2011-12-03 LAB — COMPREHENSIVE METABOLIC PANEL
AST: 14 U/L (ref 0–37)
Albumin: 3.1 g/dL — ABNORMAL LOW (ref 3.5–5.2)
BUN: 14 mg/dL (ref 6–23)
Calcium: 8.4 mg/dL (ref 8.4–10.5)
Creatinine, Ser: 0.73 mg/dL (ref 0.50–1.10)
Total Bilirubin: 0.2 mg/dL — ABNORMAL LOW (ref 0.3–1.2)
Total Protein: 6.4 g/dL (ref 6.0–8.3)

## 2011-12-03 LAB — PROTIME-INR
INR: 0.93 (ref 0.00–1.49)
Prothrombin Time: 12.7 seconds (ref 11.6–15.2)

## 2011-12-03 LAB — CBC
HCT: 38.1 % (ref 36.0–46.0)
MCHC: 33.3 g/dL (ref 30.0–36.0)
Platelets: 196 10*3/uL (ref 150–400)
RDW: 14.7 % (ref 11.5–15.5)
WBC: 9.3 10*3/uL (ref 4.0–10.5)

## 2011-12-03 LAB — APTT: aPTT: 30 seconds (ref 24–37)

## 2011-12-03 MED ORDER — GADOBENATE DIMEGLUMINE 529 MG/ML IV SOLN
20.0000 mL | Freq: Once | INTRAVENOUS | Status: AC | PRN
  Administered 2011-12-03: 20 mL via INTRAVENOUS

## 2011-12-03 MED ORDER — ESTRADIOL 0.05 MG/24HR TD PTWK: 0.0500 mg | MEDICATED_PATCH | TRANSDERMAL | Status: DC

## 2011-12-03 NOTE — Progress Notes (Signed)
   CARE MANAGEMENT NOTE 12/03/2011  Patient:  Laurie [name], Laurie [name]   Account Number:  [id number]  Date Initiated:  12/03/2011  Documentation initiated by:  Onnie Boer  Subjective/Objective Assessment:   PT WAS ADMITTED AFTER HAVING [facility] POST LP     Action/Plan:   PROGRESSION OF CARE AND DISCHARGE PLANNING   Anticipated DC Date:  12/07/2011   Anticipated DC Plan:  HOME/SELF CARE      DC Planning Services  CM consult      Choice offered to / List presented to:             Status of service:  In process, will continue to follow Medicare Important Message given?   (If response is "NO", the following Medicare IM given date fields will be blank) Date Medicare IM given:   Date Additional Medicare IM given:    Discharge Disposition:    Per UR Regulation:  Reviewed for med. necessity/level of care/duration of stay  Comments:  UR COMPLETED 12/03/2011 Onnie Boer, RN, BSN 1700 PT WAS ADMITTED WITH [facility] AFTER LP OP.  WILL F/U ON DC PLANNING

## 2011-12-03 NOTE — Progress Notes (Signed)
Subjective: No acute distress. No fever, no chest pain, no shortness of breath, no abdominal pain, currently denies nausea and vomiting.  Objective: Vital signs in last 24 hours: Temp:  [98 F (36.7 C)-98.8 F (37.1 C)] 98.5 F (36.9 C) (12/06 1444) Pulse Rate:  [59-68] 66  (12/06 1444) Resp:  [18-20] 20  (12/06 1444) BP: (98-134)/(63-87) 106/73 mmHg (12/06 1444) SpO2:  [93 %-100 %] 97 % (12/06 1444) Weight:  [114.3 kg (251 lb 15.8 oz)] 251 lb 15.8 oz (114.3 kg) (12/05 2055) Weight change:  Last BM Date: 12/02/11  Intake/Output from previous day: 12/05 0701 - 12/06 0700 In: 538.7 [P.O.:240; I.V.:296.7; IV Piggyback:2] Out: -      Physical Exam: General: Alert, awake, oriented x3, in no acute distress. HEENT: No bruits, no goiter. Heart: Regular rate and rhythm, without murmurs, rubs, gallops. Lungs: Clear to auscultation bilaterally. Abdomen: Soft, nontender, nondistended, positive bowel sounds. Extremities: No clubbing cyanosis or edema with positive pedal pulses. Neuro: Grossly intact, nonfocal. Still complaining of headache at the base of her head/neck space only after she is standing for a bowel 15-20 minutes or if there is too much light in the room.    Lab Results: Basic Metabolic Panel:  [facility] 12/03/11 0608 12/02/11 1755  NA 140 141  K 3.6 3.8  CL 107 108  CO2 24 27  GLUCOSE 87 81  BUN 14 15  CREATININE 0.73 0.80  CALCIUM 8.4 9.0  MG -- --  PHOS -- --   Liver Function Tests:  [facility] 12/03/11 0608  AST 14  ALT 18  ALKPHOS 82  BILITOT 0.2*  PROT 6.4  ALBUMIN 3.1*   CBC:  Basename 12/03/11 0608 12/02/11 1755  WBC 9.3 10.8*  NEUTROABS -- --  HGB 12.7 12.6  HCT 38.1 37.7  MCV 86.8 86.3  PLT 196 207   Coagulation:  Basename 12/03/11 0608  LABPROT 12.7  INR 0.93    Misc. Labs:  Recent Results (from the past 240 hour(s))  GRAM STAIN     Status: Normal   Collection Time   11/26/11  2:24 PM      Component Value Range Status Comment     Specimen Description CSF   Final    Special Requests NONE   Final    Gram Stain     Final    Value: CYTOSPIN PREP SLIDE     WBC PRESENT, PREDOMINANTLY MONONUCLEAR     NO ORGANISMS SEEN   Report Status 11/26/2011 FINAL   Final   CSF CULTURE     Status: Normal   Collection Time   11/26/11  2:26 PM      Component Value Range Status Comment   Specimen Description BACK   Final    Special Requests NONE   Final    Gram Stain     Final    Value: CYTOSPIN WBC PRESENT, PREDOMINANTLY MONONUCLEAR     NO ORGANISMS SEEN     TESTING PERFORMED AT [facility]   Culture NO GROWTH 3 DAYS   Final    Report Status 11/30/2011 FINAL   Final     Studies/Results: Mr Laqueta Jean Wo Contrast  12/03/2011  *RADIOLOGY REPORT*  Clinical Data: Persistent headache and light sensitivity following lumbar puncture.  MRI HEAD WITHOUT AND WITH CONTRAST  Technique:  Multiplanar, multiecho pulse sequences of the brain and surrounding structures were obtained according to standard protocol without and with intravenous contrast  Contrast: 20mL MULTIHANCE GADOBENATE DIMEGLUMINE 529 MG/ML IV SOLN  Comparison: CT head without contrast 11/26/2011.  Findings: No acute infarct, hemorrhage, or mass lesion is present. The ventricles are of normal size.  No significant extra-axial fluid collection is present.  There is no descent of the tonsils. The fourth ventricle is of normal size.  The suprasellar anatomy is normal.  The postcontrast images demonstrate no significant dural or leptomeningeal enhancement.  No pathologic enhancement is evident.  Flow is present within the major intracranial arteries.  The globes and orbits are intact.  Mild mucosal thickening is present throughout the paranasal sinuses.  The mastoid air cells are clear.  IMPRESSION:  1.  No acute or focal intracranial abnormality. 2.  No secondary signs of intracranial hypotension. 3.  Minimal mucosal disease.  Original Report Authenticated By: Jamesetta Orleans. MATTERN, M.D.     Medications: Scheduled Meds:   . estradiol  0.05 mg Transdermal Weekly  . DISCONTD: diphenhydrAMINE  25 mg Intravenous Once  . DISCONTD: estradiol  0.05 mg Transdermal Weekly  . DISCONTD: ketorolac  30 mg Intravenous Once  . DISCONTD: metoCLOPramide (REGLAN) injection  10 mg Intravenous Once   Continuous Infusions:   . sodium chloride 50 mL/hr at 12/02/11 2243   PRN Meds:.acetaminophen, docusate sodium, gadobenate dimeglumine, morphine, ondansetron (ZOFRAN) IV, oxyCODONE-acetaminophen, zolpidem, DISCONTD: ketorolac  Assessment/Plan: 1-Spinal headache: MRI with and without contrast negative. An MRV with contrast has been ordered in order to rule out sinus thrombosis. Patient also already scheduled for tomorrow morning to have a blood patch since most likely what she is experiencing is a spinal headache after lumbar puncture. No focal neurologic deficit. Continue when necessary Tylenol, morphine and Percocet for pain control.  2-DVT: SCDs.   LOS: 1 day   Laurie [name] 12/03/2011, 3:47 PM

## 2011-12-03 NOTE — Consult Note (Addendum)
Reason for Consult:persistent headaches s/p lumbar puncture on 11/29 Referring Physician: Conley Canal MD  CC: Headache  HPI: Laurie [name] is an 35 y.o. female had a lumbar puncture on November 29th, after she presented with viral like illness, fevers and headache, which was concerning for meningitis at that time. Meningitis was ruled out after lumbar puncture. She went home on Thursday itself but started having occipital headaches on saturady ie 2 days after LP. The headache was associated with photophobia and neck pain, it was 10/10 at its worse. Exacerbated by sitting up or standing up from a supine position. Headaches were reported to get better when she is lying down. She had relief with IV fluids and ketorolac etc, given at [facility] on Monday ie 2 days after it started and 3 days prior to admission. She was discharged home again but developed the headaches right back after 3-4 hours. She s\again presented to [facility] and there was a concern for spinal headache. Dr Lyman Speller was consulted over the phone by the ED at [facility] and he recommended bringing the patient in for MRI and possible blood patch. Patient denies fever, chills, weakness, altered mental status and change in urinary or bowel habits.    History reviewed. No pertinent past medical history.  Past Surgical History  Procedure Date  . Abdominal hysterectomy   . Tonsillectomy   . Cholecystectomy   . Bilateral tubal      x2  . Ectopic pregnancy surgery     No family history on file.  Social History:  reports that she has quit smoking. She does not have any smokeless tobacco history on file. She reports that she drinks alcohol. She reports that she does not use illicit drugs.  No Known Allergies  Medications: I have reviewed the patient's current medications.  ROS: The patient denies anorexia, fever, weight loss,, vision loss, decreased hearing, hoarseness, chest pain, syncope, dyspnea on exertion,  peripheral edema, balance deficits, hemoptysis, abdominal pain, melena, hematochezia, severe indigestion/heartburn, hematuria, incontinence, genital sores, muscle weakness, suspicious skin lesions, transient blindness, difficulty walking, depression, unusual weight change, abnormal bleeding, enlarged lymph nodes, angioedema, and breast masses.   Blood pressure 106/73, pulse 66, temperature 98.5 F (36.9 C), temperature source Oral, resp. rate 20, height 5\' 11"  (1.803 m), weight 251 lb 15.8 oz (114.3 kg), SpO2 97.00%.  Neurologic Examination:  Mental Status:  Alert, oriented, thought content appropriate. Speech fluent without evidence of aphasia. Able to follow 3 step commands without difficulty.  Cranial Nerves:  II: visual fields grossly normal, pupils equal, round, reactive to light and accommodation  III,IV, VI: ptosis not present, extraocular muscles extra-ocular motions intact bilaterally  V,VII: smile symmetric, facial light touch sensation normal bilaterally  VIII: hearing normal bilaterally  IX,X: gag reflex present  XI: trapezius strength/neck flexion strength normal bilaterally  XII: tongue strength normal  Motor:  Right : Upper extremity 5/5 Left: Upper extremity 5/5  Lower extremity 5/5 Lower extremity 5/5  Tone and bulk:normal tone throughout; no atrophy noted  Sensory: Pinprick and light touch intact throughout, bilaterally  Deep Tendon Reflexes: 2+ and symmetric throughout  Plantars:  Right: downgoing Left: downgoing  Cerebellar:  normal finger-to-nose, normal rapid alternating movements and normal heel-to-shin test    Results for orders placed during the hospital encounter of 12/02/11 (from the past 48 hour(s))  CBC     Status: Abnormal   Collection Time   12/02/11  5:55 PM      Component Value Range Comment  WBC 10.8 (*) 4.0 - 10.5 (K/uL)    RBC 4.37  3.87 - 5.11 (MIL/uL)    Hemoglobin 12.6  12.0 - 15.0 (g/dL)    HCT 16.1  09.6 - 04.5 (%)    MCV 86.3  78.0 -  100.0 (fL)    MCH 28.8  26.0 - 34.0 (pg)    MCHC 33.4  30.0 - 36.0 (g/dL)    RDW 40.9  81.1 - 91.4 (%)    Platelets 207  150 - 400 (K/uL)   BASIC METABOLIC PANEL     Status: Normal   Collection Time   12/02/11  5:55 PM      Component Value Range Comment   Sodium 141  135 - 145 (mEq/L)    Potassium 3.8  3.5 - 5.1 (mEq/L)    Chloride 108  96 - 112 (mEq/L)    CO2 27  19 - 32 (mEq/L)    Glucose, Bld 81  70 - 99 (mg/dL)    BUN 15  6 - 23 (mg/dL)    Creatinine, Ser 7.82  0.50 - 1.10 (mg/dL)    Calcium 9.0  8.4 - 10.5 (mg/dL)    GFR calc non Af Amer >90  >90 (mL/min)    GFR calc Af Amer >90  >90 (mL/min)   PREGNANCY, URINE     Status: Normal   Collection Time   12/02/11  6:46 PM      Component Value Range Comment   Preg Test, Ur NEGATIVE     CBC     Status: Normal   Collection Time   12/03/11  6:08 AM      Component Value Range Comment   WBC 9.3  4.0 - 10.5 (K/uL)    RBC 4.39  3.87 - 5.11 (MIL/uL)    Hemoglobin 12.7  12.0 - 15.0 (g/dL)    HCT 95.6  21.3 - 08.6 (%)    MCV 86.8  78.0 - 100.0 (fL)    MCH 28.9  26.0 - 34.0 (pg)    MCHC 33.3  30.0 - 36.0 (g/dL)    RDW 57.8  46.9 - 62.9 (%)    Platelets 196  150 - 400 (K/uL)   PROTIME-INR     Status: Normal   Collection Time   12/03/11  6:08 AM      Component Value Range Comment   Prothrombin Time 12.7  11.6 - 15.2 (seconds)    INR 0.93  0.00 - 1.49    APTT     Status: Normal   Collection Time   12/03/11  6:08 AM      Component Value Range Comment   aPTT 30  24 - 37 (seconds)   COMPREHENSIVE METABOLIC PANEL     Status: Abnormal   Collection Time   12/03/11  6:08 AM      Component Value Range Comment   Sodium 140  135 - 145 (mEq/L)    Potassium 3.6  3.5 - 5.1 (mEq/L)    Chloride 107  96 - 112 (mEq/L)    CO2 24  19 - 32 (mEq/L)    Glucose, Bld 87  70 - 99 (mg/dL)    BUN 14  6 - 23 (mg/dL)    Creatinine, Ser 5.28  0.50 - 1.10 (mg/dL)    Calcium 8.4  8.4 - 10.5 (mg/dL)    Total Protein 6.4  6.0 - 8.3 (g/dL)    Albumin 3.1 (*)  3.5 - 5.2 (g/dL)    AST 14  0 - 37 (U/L)    ALT 18  0 - 35 (U/L)    Alkaline Phosphatase 82  39 - 117 (U/L)    Total Bilirubin 0.2 (*) 0.3 - 1.2 (mg/dL)    GFR calc non Af Amer >90  >90 (mL/min)    GFR calc Af Amer >90  >90 (mL/min)     Mr Laqueta Jean Wo Contrast  12/03/2011  *RADIOLOGY REPORT*  Clinical Data: Persistent headache and light sensitivity following lumbar puncture.  MRI HEAD WITHOUT AND WITH CONTRAST  Technique:  Multiplanar, multiecho pulse sequences of the brain and surrounding structures were obtained according to standard protocol without and with intravenous contrast  Contrast: 20mL MULTIHANCE GADOBENATE DIMEGLUMINE 529 MG/ML IV SOLN  Comparison: CT head without contrast 11/26/2011.  Findings: No acute infarct, hemorrhage, or mass lesion is present. The ventricles are of normal size.  No significant extra-axial fluid collection is present.  There is no descent of the tonsils. The fourth ventricle is of normal size.  The suprasellar anatomy is normal.  The postcontrast images demonstrate no significant dural or leptomeningeal enhancement.  No pathologic enhancement is evident.  Flow is present within the major intracranial arteries.  The globes and orbits are intact.  Mild mucosal thickening is present throughout the paranasal sinuses.  The mastoid air cells are clear.  IMPRESSION:  1.  No acute or focal intracranial abnormality. 2.  No secondary signs of intracranial hypotension. 3.  Minimal mucosal disease.  Original Report Authenticated By: Jamesetta Orleans. MATTERN, M.D.     Assessment/Plan:  Patient Active Hospital Problem List: Spinal headache (12/02/2011)   Assessment: This is most likely Post-lumbar puncture headache (PLPHA) secondary to spinal tap done on 11/29. MRI done today does not show any abnormalities. Patient refuses MRA at this time as she is thinks it is going to be a waste. Will let her discuss it with attending Dr. Lyman Speller. Meningitis is a rare possibilty at this time  given she is denying fevers and there is no elevated WBC count. Plan: We may consider caffeine IV at this time which has shown to be beneficial in some studies. Patient will likely benefit from a blood patch but will discuss with attending about its timing.   Lars Mage MD [facility] Resident Pager [phone]  12/03/2011, 3:32 PM   Addendum: Please consult neuroradiology for blood patch.  Carmell Austria, MD  12/03/2011 4:27 PM

## 2011-12-04 ENCOUNTER — Inpatient Hospital Stay (HOSPITAL_COMMUNITY): Payer: Managed Care, Other (non HMO)

## 2011-12-04 MED ORDER — OXYCODONE-ACETAMINOPHEN 5-325 MG PO TABS: 1.0000 | ORAL_TABLET | ORAL | Status: AC | PRN

## 2011-12-04 MED ORDER — IOHEXOL 180 MG/ML  SOLN
10.0000 mL | Freq: Once | INTRAMUSCULAR | Status: AC | PRN
  Administered 2011-12-04: 4 mL via INTRATHECAL

## 2011-12-04 MED ORDER — ACETAMINOPHEN 325 MG PO TABS: 650.0000 mg | ORAL_TABLET | Freq: Four times a day (QID) | ORAL | Status: AC | PRN

## 2011-12-04 NOTE — Progress Notes (Signed)
Subjective: Pt seen today NPO prior to procedure. She is still experiencing headache with no other symptoms. No change in the headache since yesterday. No new weakness or numbness. Denies any fevers, chills, change in mental status or thought process. Denies change in bowel or bladder function.  Objective: Vital signs in last 24 hours: Filed Vitals:   12/03/11 2200 12/04/11 0200 12/04/11 0600 12/04/11 1045  BP: 119/73 150/53 121/84 118/74  Pulse: 68 66 75 80  Temp: 98 F (36.7 C) 98 F (36.7 C) 98.3 F (36.8 C) 98.6 F (37 C)  TempSrc: Oral Oral Oral Oral  Resp: 20 20 20 16   Height:      Weight:      SpO2: 96% 97% 94% 97%   Weight change:   Intake/Output Summary (Last 24 hours) at 12/04/11 1129 Last data filed at 12/04/11 0600  Gross per 24 hour  Intake    480 ml  Output      0 ml  Net    480 ml   Physical Exam: Neurologic Examination:  Mental Status:  Alert, oriented, thought content appropriate. Speech fluent without evidence of aphasia. Able to follow 3 step commands without difficulty.  Cranial Nerves:  II: visual fields grossly normal, pupils equal, round, reactive to light and accommodation  III,IV, VI: ptosis not present, extraocular muscles extra-ocular motions intact bilaterally  V,VII: smile symmetric, facial light touch sensation normal bilaterally  VIII: hearing normal bilaterally  IX,X: gag reflex present  XI: trapezius strength/neck flexion strength normal bilaterally  XII: tongue strength normal  Motor:  Right : Upper extremity 5/5 Left: Upper extremity 5/5  Lower extremity 5/5 Lower extremity 5/5  Tone and bulk:normal tone throughout; no atrophy noted  Sensory: Pinprick and light touch intact throughout, bilaterally  Deep Tendon Reflexes: 2+ and symmetric throughout  Plantars:  Right: downgoing Left: downgoing  Cerebellar:  normal finger-to-nose, normal rapid alternating movements and normal heel-to-shin test  Lab Results: Basic Metabolic  Panel:  Lab 12/03/11 0608 12/02/11 1755  NA 140 141  K 3.6 3.8  CL 107 108  CO2 24 27  GLUCOSE 87 81  BUN 14 15  CREATININE 0.73 0.80  CALCIUM 8.4 9.0  MG -- --  PHOS -- --   Liver Function Tests:  Lab 12/03/11 0608  AST 14  ALT 18  ALKPHOS 82  BILITOT 0.2*  PROT 6.4  ALBUMIN 3.1*  CBC:  Lab 12/03/11 0608 12/02/11 1755  WBC 9.3 10.8*  NEUTROABS -- --  HGB 12.7 12.6  HCT 38.1 37.7  MCV 86.8 86.3  PLT 196 207    Lab 12/03/11 0608  LABPROT 12.7  INR 0.93   Micro Results: Recent Results (from the past 240 hour(s))  GRAM STAIN     Status: Normal   Collection Time   11/26/11  2:24 PM      Component Value Range Status Comment   Specimen Description CSF   Final    Special Requests NONE   Final    Gram Stain     Final    Value: CYTOSPIN PREP SLIDE     WBC PRESENT, PREDOMINANTLY MONONUCLEAR     NO ORGANISMS SEEN   Report Status 11/26/2011 FINAL   Final   CSF CULTURE     Status: Normal   Collection Time   11/26/11  2:26 PM      Component Value Range Status Comment   Specimen Description BACK   Final    Special Requests NONE   Final  Gram Stain     Final    Value: CYTOSPIN WBC PRESENT, PREDOMINANTLY MONONUCLEAR     NO ORGANISMS SEEN     TESTING PERFORMED AT [facility]   Culture NO GROWTH 3 DAYS   Final    Report Status 11/30/2011 FINAL   Final    Studies/Results: Mr Laqueta Jean EA Contrast  12/03/2011  *RADIOLOGY REPORT*  Clinical Data: Persistent headache and light sensitivity following lumbar puncture.  MRI HEAD WITHOUT AND WITH CONTRAST  Technique:  Multiplanar, multiecho pulse sequences of the brain and surrounding structures were obtained according to standard protocol without and with intravenous contrast  Contrast: 20mL MULTIHANCE GADOBENATE DIMEGLUMINE 529 MG/ML IV SOLN  Comparison: CT head without contrast 11/26/2011.  Findings: No acute infarct, hemorrhage, or mass lesion is present. The ventricles are of normal size.  No significant extra-axial fluid collection  is present.  There is no descent of the tonsils. The fourth ventricle is of normal size.  The suprasellar anatomy is normal.  The postcontrast images demonstrate no significant dural or leptomeningeal enhancement.  No pathologic enhancement is evident.  Flow is present within the major intracranial arteries.  The globes and orbits are intact.  Mild mucosal thickening is present throughout the paranasal sinuses.  The mastoid air cells are clear.  IMPRESSION:  1.  No acute or focal intracranial abnormality. 2.  No secondary signs of intracranial hypotension. 3.  Minimal mucosal disease.  Original Report Authenticated By: Jamesetta Orleans. MATTERN, M.D.   Medications: I have reviewed the patient's current medications. Scheduled Meds:   . estradiol  0.05 mg Transdermal Weekly   Continuous Infusions:   . sodium chloride 50 mL/hr at 12/02/11 2243   PRN Meds:.acetaminophen, docusate sodium, morphine, oxyCODONE-acetaminophen, zolpidem Assessment/Plan: 1. Spinal headache - will be getting blood patch today from neuroradiology and will follow up on resolution of her headache after that.  Likely etiology from LP on 11/26.   Other Active Problems:  URTI (acute upper respiratory infection)   LOS: 2 days   Genella Mech 12/04/2011, 11:29 AM

## 2011-12-04 NOTE — Progress Notes (Signed)
Patient ID: Laurie [name], female   DOB: March 07, 1976, 35 y.o.   MRN: [id number] Right L2/3 Blood patch under area of previous needle stick from LP 11/29.    16 ml of autologous blood injected into epidural space.  No complications  See radiology report for full procedure note.

## 2011-12-04 NOTE — Progress Notes (Signed)
   CARE MANAGEMENT NOTE 12/04/2011  Patient:  Laurie [name], Laurie [name]   Account Number:  [id number]  Date Initiated:  12/03/2011  Documentation initiated by:  Onnie Boer  Subjective/Objective Assessment:   PT WAS ADMITTED AFTER HAVING HA POST LP     Action/Plan:   PROGRESSION OF CARE AND DISCHARGE PLANNING   Anticipated DC Date:  12/07/2011   Anticipated DC Plan:  HOME/SELF CARE      DC Planning Services  CM consult      Choice offered to / List presented to:             Status of service:  Completed, signed off Medicare Important Message given?   (If response is "NO", the following Medicare IM given date fields will be blank) Date Medicare IM given:   Date Additional Medicare IM given:    Discharge Disposition:  HOME/SELF CARE  Per UR Regulation:  Reviewed for med. necessity/level of care/duration of stay  Comments:  UR COMPLETED 12/03/2011 Onnie Boer, RN, BSN 1700 PT WAS ADMITTED WITH [facility] AFTER LP OP.  WILL F/U ON DC PLANNING

## 2011-12-04 NOTE — Progress Notes (Signed)
Patient discharged home with prescriptions. Instructed to rest for the next few days. Patient states pain is resolved after blood patch. DC'ed home with husband. Will close plan of care.

## 2011-12-04 NOTE — Discharge Summary (Signed)
Physician Discharge Summary  Patient ID: Laurie [name] MRN: [id number] DOB/AGE: 1976-12-28 35 y.o.  Admit date: 12/02/2011 Discharge date: 12/04/2011  Primary Care Physician:  No primary provider on file.   Discharge Diagnoses:   1-Spinal HA.  Present on Admission:  .Spinal headache .URTI (acute upper respiratory infection)  Current Discharge Medication List    CONTINUE these medications which have CHANGED   Details  acetaminophen (TYLENOL) 325 MG tablet Take 2 tablets (650 mg total) by mouth every 6 (six) hours as needed for pain. Qty: 30 tablet    oxyCODONE-acetaminophen (PERCOCET) 5-325 MG per tablet Take 1 tablet by mouth every 4 (four) hours as needed for pain. For pain Qty: 30 tablet, Refills: 0      CONTINUE these medications which have NOT CHANGED   Details  estradiol (VIVELLE-DOT) 0.1 MG/24HR Place 1 patch onto the skin 2 (two) times a week. Replace on Tuesday and Saturday      STOP taking these medications     ondansetron (ZOFRAN ODT) 8 MG disintegrating tablet      Aspirin-Caffeine (BAYER BACK & BODY PAIN EX ST) 500-32.5 MG TABS      ibuprofen (ADVIL,MOTRIN) 200 MG tablet          Disposition and Follow-up:  Patient discharged in a stable and improved condition. At this point she is not complaining of headache and feels a lot much better. Patient also endorses significant improvement of her photophobia. She has been advised to rest as much as possible over the next 24 hours particularly lying on bed as much as she can. She received a prescription for Percocet and has also been instructed to use Tylenol. Patient advised to arrange followup with PCP over the next 10 days.  Consults:   Neurology   Significant Diagnostic Studies:    Mr Lodema Pilot Contrast  12/03/2011  *RADIOLOGY REPORT*  Clinical Data: Persistent headache and light sensitivity following lumbar puncture.  MRI HEAD WITHOUT AND WITH CONTRAST  Technique:  Multiplanar, multiecho pulse  sequences of the brain and surrounding structures were obtained according to standard protocol without and with intravenous contrast  Contrast: 20mL MULTIHANCE GADOBENATE DIMEGLUMINE 529 MG/ML IV SOLN  Comparison: CT head without contrast 11/26/2011.  Findings: No acute infarct, hemorrhage, or mass lesion is present. The ventricles are of normal size.  No significant extra-axial fluid collection is present.  There is no descent of the tonsils. The fourth ventricle is of normal size.  The suprasellar anatomy is normal.  The postcontrast images demonstrate no significant dural or leptomeningeal enhancement.  No pathologic enhancement is evident.  Flow is present within the major intracranial arteries.  The globes and orbits are intact.  Mild mucosal thickening is present throughout the paranasal sinuses.  The mastoid air cells are clear.  IMPRESSION:  1.  No acute or focal intracranial abnormality. 2.  No secondary signs of intracranial hypotension. 3.  Minimal mucosal disease.  Original Report Authenticated By: Jamesetta Orleans. MATTERN, M.D.   Ir Fluoro Guide Ndl Plmt / Bx  12/04/2011  *RADIOLOGY REPORT*  BLOOD PATCH  Clinical Data: Status post lumbar puncture 11/26/2011 in the [place] apartment.  The procedure note states that the puncture was at L4- 5.  Fluoroscopy over the puncture site reveals the puncture was at L2-3.  FLUORO GUIDED NEEDLE PLACEMENT  Procedure:  The procedure, risks, benefits, and alternatives were explained to the patient. Questions regarding the procedure were encouraged and answered. The patient understands and consents to the procedure.  LUMBAR  EPIDURAL BLOOD PATCH: 20 ml of blood were withdrawn from the patient's antecubital fossa.  An epidural approach was taken on theright at L2-3using a 20 gauge Crawford epidural needle. Epidural positioning was confirmed by injecting a small amount of Omnipaque 180.  There was no vascular communication.  16 ml of the patient's blood was slowly injected  into the epidural space in this location.  The procedure was well-tolerated and she was discharged in good condition with instructions to lie down for additional day.  Complications: None.  Fluoro Time:  0.9 minutes  IMPRESSION: Lumbar epidural blood patch on theright at L2-3  Original Report Authenticated By: Jamesetta Orleans. MATTERN, M.D.   Brief H and P: Laurie [name] is an 35 y.o. female who had a lumbar puncture on November 29th, after she presented with viral like illness, fevers and headache; which was concerning for meningitis at that time. Meningitis was ruled out after lumbar puncture. She went home on Thursday itself but started having occipital headaches on saturday (exactly 2 days after LP). The headache was associated with photophobia and neck pain, it was 10/10 at its worse. Exacerbated by sitting up or standing up from a supine position. Headaches were reported to get better when she is lying down. She had relief with IV fluids and ketorolac; given at [facility] on Monday (approx 3 days prior to admission). She was discharged home again but developed the headaches right back after 3-4 hours. Patient returned to [facility] and there was a concern for spinal headache. Dr Lyman Speller (neurology)  was consulted over the phone by the ED at [facility] and he recommended bringing the patient in for MRI and if needed blood patch. Patient denies fever, chills, weakness, altered mental status and change in urinary or bowel habits.    Hospital Course:  1-Spinal headache: Patient received a blood patch per neuroradiology and at this point is feeling a lot much better. No other complaints or neurologic deficit has been appreciated on examination. Vital signs are stable and the plan is to send the patient home with when necessary medications, instructions for complaining of rest and to keep herself well hydrated. In case her symptoms worsen or return; Patient has been advised to come back to  the emergency department.   Time spent on Discharge: 35 minutes  Signed: [name] 12/04/2011, 2:51 PM

## 2012-03-30 ENCOUNTER — Ambulatory Visit (INDEPENDENT_AMBULATORY_CARE_PROVIDER_SITE_OTHER): Payer: BC Managed Care – PPO | Admitting: Obstetrics & Gynecology

## 2012-03-30 ENCOUNTER — Encounter: Payer: Self-pay | Admitting: Obstetrics & Gynecology

## 2012-03-30 VITALS — BP 127/87 | HR 84 | Temp 98.5°F | Resp 16 | Ht 71.0 in | Wt 266.0 lb

## 2012-03-30 DIAGNOSIS — Z0000 Encounter for general adult medical examination without abnormal findings: Secondary | ICD-10-CM

## 2012-03-30 DIAGNOSIS — Z01419 Encounter for gynecological examination (general) (routine) without abnormal findings: Secondary | ICD-10-CM

## 2012-03-30 DIAGNOSIS — R635 Abnormal weight gain: Secondary | ICD-10-CM

## 2012-03-30 MED ORDER — TESTOSTERONE PROPIONATE 2 % TD OINT: TOPICAL_OINTMENT | TRANSDERMAL | Status: DC

## 2012-03-30 MED ORDER — ESTRADIOL 1 MG PO TABS: 1.0000 mg | ORAL_TABLET | Freq: Every day | ORAL | Status: DC

## 2012-03-30 NOTE — Progress Notes (Signed)
Subjective:    Laurie [name] is a 36 y.o. female who presents for an annual exam. Her biggest complaint is that of weight gain (40 #) since TAH/BSO in 7/12. She has started working out and has improved her eating habits without help. She reports her TSH to be normal last month. The patient is sexually active but she reports that her libido is significantly decreased since her TAH/BSO.  GYN screening history: last pap: was normal. The patient wears seatbelts: at times- rain and snow. The patient participates in regular exercise: yes. Has the patient ever been transfused or tattooed?: yes. (tattoos)The patient reports that there is not domestic violence in her life.   Menstrual History: OB History    Grav Para Term Preterm Abortions TAB SAB Ect Mult Living                  Menarche age: 17 No LMP recorded. Patient has had a hysterectomy.    The following portions of the patient's history were reviewed and updated as appropriate: allergies, current medications, past family history, past medical history, past social history, past surgical history and problem list.  Review of Systems A comprehensive review of systems was negative. She has been married to her high school sweetheart for 16 years. She denies dysparunia and reports only rare hot flashes.   Objective:    BP 127/87  Pulse 84  Temp(Src) 98.5 F (36.9 C) (Oral)  Resp 16  Ht 5\' 11"  (1.803 m)  Wt 266 lb (120.657 kg)  BMI 37.10 kg/m2  General Appearance:    Alert, cooperative, no distress, appears stated age  Head:    Normocephalic, without obvious abnormality, atraumatic  Eyes:    PERRL, conjunctiva/corneas clear, EOM's intact, fundi    benign, both eyes  Ears:    Normal TM's and external ear canals, both ears  Nose:   Nares normal, septum midline, mucosa normal, no drainage    or sinus tenderness  Throat:   Lips, mucosa, and tongue normal; teeth and gums normal  Neck:   Supple, symmetrical, trachea midline, no adenopathy;      thyroid:  no enlargement/tenderness/nodules; no carotid   bruit or JVD  Back:     Symmetric, no curvature, ROM normal, no CVA tenderness  Lungs:     Clear to auscultation bilaterally, respirations unlabored  Chest Wall:    No tenderness or deformity   Heart:    Regular rate and rhythm, S1 and S2 normal, no murmur, rub   or gallop  Breast Exam:    No tenderness, masses, or nipple abnormality  Abdomen:     Soft, non-tender, bowel sounds active all four quadrants,    no masses, no organomegaly  Genitalia:    Normal female without lesion, discharge or tenderness, normal bimanual exam, no masses     Extremities:   Extremities normal, atraumatic, no cyanosis or edema  Pulses:   2+ and symmetric all extremities  Skin:   Skin color, texture, turgor normal, no rashes or lesions  Lymph nodes:   Cervical, supraclavicular, and axillary nodes normal  Neurologic:   CNII-XII intact, normal strength, sensation and reflexes    throughout  .    Assessment:    Healthy female exam.    Plan:     I will change her HRT to estradiol and topical testosterone. If there is no change in her weight/inches in 3 months, I will refer her to the weight loss clinic.

## 2012-08-02 ENCOUNTER — Encounter: Payer: Self-pay | Admitting: Obstetrics & Gynecology

## 2012-08-02 ENCOUNTER — Ambulatory Visit (INDEPENDENT_AMBULATORY_CARE_PROVIDER_SITE_OTHER): Payer: BC Managed Care – PPO | Admitting: Obstetrics & Gynecology

## 2012-08-02 VITALS — BP 124/85 | HR 86 | Temp 98.0°F | Resp 16 | Ht 71.0 in | Wt 268.0 lb

## 2012-08-02 DIAGNOSIS — N951 Menopausal and female climacteric states: Secondary | ICD-10-CM

## 2012-08-02 DIAGNOSIS — N898 Other specified noninflammatory disorders of vagina: Secondary | ICD-10-CM

## 2012-08-02 DIAGNOSIS — R232 Flushing: Secondary | ICD-10-CM

## 2012-08-02 DIAGNOSIS — N949 Unspecified condition associated with female genital organs and menstrual cycle: Secondary | ICD-10-CM

## 2012-08-02 MED ORDER — ESTRADIOL 1 MG PO TABS: 1.5000 mg | ORAL_TABLET | Freq: Every day | ORAL | Status: DC

## 2012-08-02 NOTE — Progress Notes (Signed)
  Subjective:    Patient ID: Laurie [name], female    DOB: 07-08-1976, 36 y.o.   MRN: [id number]  HPI  36 yo who underwent TAH/BSO at [facility] for what sounds like ectopic followed by endometriosis. She complains of continued decrease libido, no help with testosterone. She also complains of a vaginal odor. Her hot flashes are somewhat improved since she started taking 1.5 mg estradiol.  Review of Systems     Objective:   Physical Exam No vaginal odor, normal appearing discharge       Assessment & Plan:  Wet prep Estradiol level Refill of po estradiol, but increased to 1.5 mg. I will refer her to Dr. Cathey Endow for weight loss.

## 2012-08-03 LAB — WET PREP, GENITAL: Trich, Wet Prep: NONE SEEN

## 2012-08-04 ENCOUNTER — Telehealth: Payer: Self-pay | Admitting: *Deleted

## 2012-08-04 DIAGNOSIS — B373 Candidiasis of vulva and vagina: Secondary | ICD-10-CM

## 2012-08-04 MED ORDER — FLUCONAZOLE 150 MG PO TABS: 150.0000 mg | ORAL_TABLET | Freq: Once | ORAL | Status: DC

## 2012-08-04 NOTE — Telephone Encounter (Signed)
RX sent to [facility] for Diflucan 150 per Dr Marice Potter.  Pt was positive for yeast on her wet prep.

## 2012-08-05 LAB — ESTRADIOL, FREE

## 2013-05-25 LAB — HM COLONOSCOPY

## 2013-06-05 ENCOUNTER — Encounter: Payer: BC Managed Care – PPO | Admitting: Obstetrics & Gynecology

## 2013-07-05 ENCOUNTER — Encounter: Payer: Self-pay | Admitting: Physician Assistant

## 2013-07-05 ENCOUNTER — Ambulatory Visit (INDEPENDENT_AMBULATORY_CARE_PROVIDER_SITE_OTHER): Payer: PRIVATE HEALTH INSURANCE | Admitting: Physician Assistant

## 2013-07-05 VITALS — BP 132/99 | HR 78 | Wt 264.0 lb

## 2013-07-05 DIAGNOSIS — J069 Acute upper respiratory infection, unspecified: Secondary | ICD-10-CM

## 2013-07-05 DIAGNOSIS — J029 Acute pharyngitis, unspecified: Secondary | ICD-10-CM

## 2013-07-05 MED ORDER — AMOXICILLIN-POT CLAVULANATE 875-125 MG PO TABS: 1.0000 | ORAL_TABLET | Freq: Two times a day (BID) | ORAL | Status: DC

## 2013-07-05 NOTE — Patient Instructions (Addendum)
mucinex D twice a day. Drink water.

## 2013-07-05 NOTE — Progress Notes (Signed)
  Subjective:    Patient ID: Laurie [name], female    DOB: [birth year], 37 y.o.   MRN: [id number]  HPI Patient is a 37 yo female who presents to the clinic to establish care and discuss sore throat. She has had a hysterectomy due to pregnancy on ovary after a BTL. She is on estrace currently. NO other past medical conditions. She has had gallbladder removed. Quit smoking in 2009. Family hx positive for colon cancer, diabetes, HTN, hyperlipidemia, Thyroid issues.   She has had a ST since Sunday 3 days ago. She leaves for vacation on Saturday and does not want to get any worse. No one else is family sick. Left ear pain but no discharge. Feels like swallowing is harder than usual. She is able to eat and drink but has no appetite. Denies any fever, chills, n/v/d. NO wheezing/SOB, sinus pressure. Tried zyrtec and benadryl with no relief.   Unknown last pap.    Review of Systems  All other systems reviewed and are negative.       Objective:   Physical Exam  Constitutional: She is oriented to person, place, and time. She appears well-developed and well-nourished.  HENT:  Head: Normocephalic and atraumatic.  TM's are slightly erythatous with no pus or blood.   Oropharynx is erythematous without exudate or swollen tonsils.   No maxillary or frontal tenderness. Turbinates red and swollen.   Eyes: Conjunctivae are normal. Right eye exhibits no discharge. Left eye exhibits no discharge.  Neck: Normal range of motion. Neck supple.  Cardiovascular: Normal rate, regular rhythm and normal heart sounds.   Pulmonary/Chest: Effort normal and breath sounds normal. She has no wheezes.  Lymphadenopathy:    She has no cervical adenopathy.  Neurological: She is alert and oriented to person, place, and time.  Skin: Skin is warm and dry.  Psychiatric: She has a normal mood and affect. Her behavior is normal.          Assessment & Plan:  Sore throat/URI- I reassured pt that at this time i do think  infection is viral in nature. I would try mucinex D twice a day, vit z and zinc. If not improving i did give rx for Augmentin. Call with any problems.   Pt aware she need CPE in near future.

## 2013-08-04 ENCOUNTER — Ambulatory Visit (INDEPENDENT_AMBULATORY_CARE_PROVIDER_SITE_OTHER): Payer: PRIVATE HEALTH INSURANCE | Admitting: Physician Assistant

## 2013-08-04 ENCOUNTER — Encounter: Payer: Self-pay | Admitting: Physician Assistant

## 2013-08-04 VITALS — BP 127/90 | HR 77 | Wt 268.0 lb

## 2013-08-04 DIAGNOSIS — E7800 Pure hypercholesterolemia, unspecified: Secondary | ICD-10-CM

## 2013-08-04 DIAGNOSIS — Z131 Encounter for screening for diabetes mellitus: Secondary | ICD-10-CM

## 2013-08-04 DIAGNOSIS — Z0000 Encounter for general adult medical examination without abnormal findings: Secondary | ICD-10-CM

## 2013-08-04 DIAGNOSIS — K5909 Other constipation: Secondary | ICD-10-CM | POA: Insufficient documentation

## 2013-08-04 LAB — COMPLETE METABOLIC PANEL WITH GFR
ALT: 27 U/L (ref 0–35)
CO2: 28 mEq/L (ref 19–32)
Calcium: 9.8 mg/dL (ref 8.4–10.5)
Chloride: 105 mEq/L (ref 96–112)
Creat: 0.94 mg/dL (ref 0.50–1.10)
GFR, Est African American: >89 mL/min
Total Protein: 7.5 g/dL (ref 6.0–8.3)

## 2013-08-04 LAB — LIPID PANEL
LDL Cholesterol: 140 mg/dL — ABNORMAL HIGH (ref 0–99)
Triglycerides: 149 mg/dL (ref <150)

## 2013-08-04 NOTE — Patient Instructions (Signed)

## 2013-08-04 NOTE — Progress Notes (Signed)
  Subjective:     Laurie [name] is a 37 y.o. female and is here for a comprehensive physical exam. The patient reports no problems.  Per patient she did a work screening in her cholesterol is high would like recheck today.  History   Social History  . Marital Status: Married    Spouse Name: N/A    Number of Children: N/A  . Years of Education: N/A   Occupational History  . Not on file.   Social History Main Topics  . Smoking status: Former Smoker -- 2.00 packs/day for 20 years    Types: Cigarettes  . Smokeless tobacco: Never Used  . Alcohol Use: 0.0 oz/week    2-3 Cans of beer per week     Comment: daily  . Drug Use: No  . Sexually Active: Yes -- Female partner(s)    Birth Control/ Protection: Surgical   Other Topics Concern  . Not on file   Social History Narrative  . No narrative on file   Health Maintenance  Topic Date Due  . Influenza Vaccine  08/28/2013  . Tetanus/tdap  12/28/2014  . Pap Smear  05/29/2015    The following portions of the patient's history were reviewed and updated as appropriate: allergies, current medications, past family history, past medical history, past social history, past surgical history and problem list.  Review of Systems A comprehensive review of systems was negative.   Objective:    BP 127/90  Pulse 77  Wt 268 lb (121.564 kg)  BMI 37.39 kg/m2 General appearance: alert, cooperative and appears stated age/obese Head: Normocephalic, without obvious abnormality, atraumatic Eyes: conjunctivae/corneas clear. PERRL, EOM's intact. Fundi benign. Ears: normal TM's and external ear canals both ears Nose: Nares normal. Septum midline. Mucosa normal. No drainage or sinus tenderness. Throat: lips, mucosa, and tongue normal; teeth and gums normal Neck: no adenopathy, no carotid bruit, no JVD, supple, symmetrical, trachea midline and thyroid not enlarged, symmetric, no tenderness/mass/nodules Back: symmetric, no curvature. ROM normal. No  CVA tenderness. Lungs: clear to auscultation bilaterally Heart: regular rate and rhythm, S1, S2 normal, no murmur, click, rub or gallop Abdomen: soft, non-tender; bowel sounds normal; no masses,  no organomegaly Extremities: extremities normal, atraumatic, no cyanosis or edema Pulses: 2+ and symmetric Skin: Skin color, texture, turgor normal. No rashes or lesions Lymph nodes: Cervical, supraclavicular, and axillary nodes normal. Neurologic: Grossly normal    Assessment:    Healthy female exam.      Plan:     CPE-Pap smear up-to-date. Vaccines up-to-date. Discussed with patient the importance of calcium and vitamin D. Gave handout on wellness. Discussed with patient importance of staying active and exercising regularly. Goal of 150 minutes a week of exercise was set. We'll get fasting labs today. Discussed with patient if LDL elevated we may need to start a statin. Side effects of myagias were discussed. Pt aware.  See After Visit Summary for Counseling Recommendations

## 2013-08-25 ENCOUNTER — Ambulatory Visit (INDEPENDENT_AMBULATORY_CARE_PROVIDER_SITE_OTHER): Payer: PRIVATE HEALTH INSURANCE | Admitting: Physician Assistant

## 2013-08-25 ENCOUNTER — Encounter: Payer: Self-pay | Admitting: Physician Assistant

## 2013-08-25 VITALS — BP 118/82 | HR 82 | Wt 269.0 lb

## 2013-08-25 DIAGNOSIS — N200 Calculus of kidney: Secondary | ICD-10-CM

## 2013-08-25 DIAGNOSIS — R300 Dysuria: Secondary | ICD-10-CM

## 2013-08-25 DIAGNOSIS — R8290 Unspecified abnormal findings in urine: Secondary | ICD-10-CM

## 2013-08-25 DIAGNOSIS — N390 Urinary tract infection, site not specified: Secondary | ICD-10-CM

## 2013-08-25 DIAGNOSIS — Z87442 Personal history of urinary calculi: Secondary | ICD-10-CM

## 2013-08-25 DIAGNOSIS — R319 Hematuria, unspecified: Secondary | ICD-10-CM

## 2013-08-25 DIAGNOSIS — R82998 Other abnormal findings in urine: Secondary | ICD-10-CM

## 2013-08-25 DIAGNOSIS — R109 Unspecified abdominal pain: Secondary | ICD-10-CM

## 2013-08-25 HISTORY — DX: Personal history of urinary calculi: Z87.442

## 2013-08-25 LAB — POCT URINALYSIS DIPSTICK
Protein, UA: NEGATIVE
Spec Grav, UA: 1.015
Urobilinogen, UA: 0.2
pH, UA: 6

## 2013-08-25 MED ORDER — NITROFURANTOIN MONOHYD MACRO 100 MG PO CAPS: 100.0000 mg | ORAL_CAPSULE | Freq: Two times a day (BID) | ORAL | Status: DC

## 2013-08-25 NOTE — Progress Notes (Signed)
  Subjective:    Patient ID: Laurie [name], female    DOB: December 22, 1976, 37 y.o.   MRN: [id number]  HPI Patient presents to the clinic with dysuria, urinary frequency, urinary urgency, lower abdominal pressure and low back pain. Symptoms started Wednesday night and have progressively gotten worse. This morning pain was about a 9/10. She urinated and passed a stone per patient. She is much better since passing the stone. Patient does admit that she never had visualization of stone. Patient denies any fever, chills, nausea, vomiting. She still has some dysuria, increased frequency and increased urgency. Patient does have a history of kidney stones. Her last kidney stone was last April. Patient has taken some ibuprofen and did help with the pain minimally. She also did drink a beer last night to help with passage of stone.     Review of Systems     Objective:   Physical Exam  Constitutional: She is oriented to person, place, and time. She appears well-developed and well-nourished.  HENT:  Head: Normocephalic and atraumatic.  Cardiovascular: Normal rate, regular rhythm and normal heart sounds.   Pulmonary/Chest: Effort normal and breath sounds normal.  No CVA tenderness.  Abdominal: Soft. Bowel sounds are normal.  Bilateral lower abdominal mild tenderness to palpation.  Neurological: She is alert and oriented to person, place, and time.  Skin: Skin is warm and dry.  Psychiatric: She has a normal mood and affect. Her behavior is normal.          Assessment & Plan:  Possible kidney stone/UTI- UA was positive leukocytes and blood. Treated with Macrobid for 7 days. Gave handout for UTI. We'll send for culture just in case not improving on Macrobid. Discussed causes of kidney stones and how there might be something in her diet that are causing the exacerbations.

## 2013-08-25 NOTE — Patient Instructions (Addendum)
Urinary Tract Infection  Urinary tract infections (UTIs) can develop anywhere along your urinary tract. Your urinary tract is your body's drainage system for removing wastes and extra water. Your urinary tract includes two kidneys, two ureters, a bladder, and a urethra. Your kidneys are a pair of bean-shaped organs. Each kidney is about the size of your fist. They are located below your ribs, one on each side of your spine.  CAUSES  Infections are caused by microbes, which are microscopic organisms, including fungi, viruses, and bacteria. These organisms are so small that they can only be seen through a microscope. Bacteria are the microbes that most commonly cause UTIs.  SYMPTOMS   Symptoms of UTIs may vary by age and gender of the patient and by the location of the infection. Symptoms in young women typically include a frequent and intense urge to urinate and a painful, burning feeling in the bladder or urethra during urination. Older women and men are more likely to be tired, shaky, and weak and have muscle aches and abdominal pain. A fever may mean the infection is in your kidneys. Other symptoms of a kidney infection include pain in your back or sides below the ribs, nausea, and vomiting.  DIAGNOSIS  To diagnose a UTI, your caregiver will ask you about your symptoms. Your caregiver also will ask to provide a urine sample. The urine sample will be tested for bacteria and white blood cells. White blood cells are made by your body to help fight infection.  TREATMENT   Typically, UTIs can be treated with medication. Because most UTIs are caused by a bacterial infection, they usually can be treated with the use of antibiotics. The choice of antibiotic and length of treatment depend on your symptoms and the type of bacteria causing your infection.  HOME CARE INSTRUCTIONS   If you were prescribed antibiotics, take them exactly as your caregiver instructs you. Finish the medication even if you feel better after you  have only taken some of the medication.   Drink enough water and fluids to keep your urine clear or pale yellow.   Avoid caffeine, tea, and carbonated beverages. They tend to irritate your bladder.   Empty your bladder often. Avoid holding urine for long periods of time.   Empty your bladder before and after sexual intercourse.   After a bowel movement, women should cleanse from front to back. Use each tissue only once.  SEEK MEDICAL CARE IF:    You have back pain.   You develop a fever.   Your symptoms do not begin to resolve within 3 days.  SEEK IMMEDIATE MEDICAL CARE IF:    You have severe back pain or lower abdominal pain.   You develop chills.   You have nausea or vomiting.   You have continued burning or discomfort with urination.  MAKE SURE YOU:    Understand these instructions.   Will watch your condition.   Will get help right away if you are not doing well or get worse.  Document Released: 09/23/2005 Document Revised: 06/14/2012 Document Reviewed: 01/22/2012  [employer] Patient Information 2014 [employer].

## 2013-10-24 ENCOUNTER — Ambulatory Visit (INDEPENDENT_AMBULATORY_CARE_PROVIDER_SITE_OTHER): Payer: PRIVATE HEALTH INSURANCE | Admitting: Family Medicine

## 2013-10-24 ENCOUNTER — Encounter: Payer: Self-pay | Admitting: Family Medicine

## 2013-10-24 VITALS — BP 125/89 | HR 89 | Temp 98.1°F | Wt 274.0 lb

## 2013-10-24 DIAGNOSIS — J209 Acute bronchitis, unspecified: Secondary | ICD-10-CM

## 2013-10-24 MED ORDER — HYDROCODONE-HOMATROPINE 5-1.5 MG/5ML PO SYRP: 5.0000 mL | ORAL_SOLUTION | Freq: Four times a day (QID) | ORAL | Status: DC | PRN

## 2013-10-24 NOTE — Progress Notes (Signed)
CC: Laurie [name] is a 37 y.o. female is here for URI?   Subjective: HPI:  Patient claims her cough is nonproductive it has been present since Sunday came on acutely has not been getting better or worse since onset. Described as a dry cough without blood and sputum. Associated fevers and chills along with night sweats which have also been present since Sunday. She denies wheezing, shortness of breath, orthopnea, PND. Denies any history of lung disease. Denies nasal congestion sinus pressure, nor headache. Denies nausea, vomiting, diarrhea, or abdominal pain.   Review Of Systems Outlined In HPI  Past Medical History  Diagnosis Date  . Obesity   . Endometriosis   . Meningitis     spinal     Family History  Problem Relation Age of Onset  . Thyroid disease Mother   . Cancer Paternal Grandfather     colon  . Cancer Maternal Grandfather     colon  . Hyperlipidemia Maternal Grandfather   . Hypertension Maternal Grandfather   . Alcohol abuse Father   . Diabetes Father   . Hyperlipidemia Maternal Grandmother   . Hypertension Maternal Grandmother   . Thyroid disease Maternal Grandmother   . Diabetes Paternal Grandmother      History  Substance Use Topics  . Smoking status: Former Smoker -- 2.00 packs/day for 20 years    Types: Cigarettes  . Smokeless tobacco: Never Used  . Alcohol Use: 0.0 oz/week    2-3 Cans of beer per week     Comment: daily     Objective: Filed Vitals:   10/24/13 1111  BP: 125/89  Pulse: 89  Temp: 98.1 F (36.7 C)    General: Alert and Oriented, No Acute Distress HEENT: Pupils equal, round, reactive to light. Conjunctivae clear.  External ears unremarkable, canals clear with intact TMs with appropriate landmarks.  Middle ear appears open without effusion. Pink inferior turbinates.  Moist mucous membranes, pharynx without inflammation nor lesions.  Neck supple without palpable lymphadenopathy nor abnormal masses. Lungs: Clear to auscultation  bilaterally, no wheezing/ronchi/rales.  Comfortable work of breathing. Good air movement. Extremities: No peripheral edema.  Strong peripheral pulses.  Mental Status: No depression, anxiety, nor agitation. Skin: Warm and dry.  Assessment & Plan: Laurie [name] was seen today for uri?.  Diagnoses and associated orders for this visit:  Acute bronchitis - HYDROcodone-homatropine (HYCODAN) 5-1.5 MG/5ML syrup; Take 5 mLs by mouth every 6 (six) hours as needed for cough.    Acute bronchitis: Counseled that this is most likely viral, symptomatically control with Tylenol cold and sinus, Hycodan if needed to help with sleep. If worsening by the end of the week will call in antibiotic. Signs and symptoms requring emergent/urgent reevaluation were discussed with the patient.  Return if symptoms worsen or fail to improve.

## 2013-11-09 ENCOUNTER — Encounter: Payer: Self-pay | Admitting: Family Medicine

## 2013-11-09 DIAGNOSIS — E669 Obesity, unspecified: Secondary | ICD-10-CM | POA: Insufficient documentation

## 2013-11-09 DIAGNOSIS — E8881 Metabolic syndrome: Secondary | ICD-10-CM | POA: Insufficient documentation

## 2013-12-07 ENCOUNTER — Ambulatory Visit (INDEPENDENT_AMBULATORY_CARE_PROVIDER_SITE_OTHER): Payer: PRIVATE HEALTH INSURANCE | Admitting: Family Medicine

## 2013-12-07 ENCOUNTER — Encounter: Payer: Self-pay | Admitting: Family Medicine

## 2013-12-07 VITALS — BP 129/88 | HR 100 | Temp 97.8°F | Wt 270.0 lb

## 2013-12-07 DIAGNOSIS — R509 Fever, unspecified: Secondary | ICD-10-CM

## 2013-12-07 DIAGNOSIS — R112 Nausea with vomiting, unspecified: Secondary | ICD-10-CM

## 2013-12-07 DIAGNOSIS — R197 Diarrhea, unspecified: Secondary | ICD-10-CM

## 2013-12-07 MED ORDER — DIPHENOXYLATE-ATROPINE 2.5-0.025 MG PO TABS: 1.0000 | ORAL_TABLET | Freq: Four times a day (QID) | ORAL | Status: DC | PRN

## 2013-12-07 MED ORDER — PROMETHAZINE HCL 50 MG PO TABS: 50.0000 mg | ORAL_TABLET | Freq: Four times a day (QID) | ORAL | Status: DC | PRN

## 2013-12-07 NOTE — Progress Notes (Signed)
CC: Laurie [name] is a 37 y.o. female is here for vomiting and diarrhea   Subjective: HPI:  Complains of subjective fever and chills with sweats accompanied by body aches all of which is been moderate in severity persistent not improving with ibuprofen or Tylenol. Associated with decreased appetite. Symptoms began on Tuesday have not been getting better or worse since onset. No known sick contacts. Very few complaints other than above. Abdominal pain is mild and described as diffuse soreness without any focal localization of the pain.  No recent antibiotic use no travel and no exposure to health care settings in the last month.   denies cough, wheezing, shortness of breath, confusion, motor or sensory disturbances, facial pressure, nasal congestion, headache nor rashes   Review Of Systems Outlined In HPI  Past Medical History  Diagnosis Date  . Obesity   . Endometriosis   . Meningitis     spinal  . History of kidney stones 08/25/2013     Family History  Problem Relation Age of Onset  . Thyroid disease Mother   . Cancer Paternal Grandfather     colon  . Cancer Maternal Grandfather     colon  . Hyperlipidemia Maternal Grandfather   . Hypertension Maternal Grandfather   . Alcohol abuse Father   . Diabetes Father   . Hyperlipidemia Maternal Grandmother   . Hypertension Maternal Grandmother   . Thyroid disease Maternal Grandmother   . Diabetes Paternal Grandmother      History  Substance Use Topics  . Smoking status: Former Smoker -- 2.00 packs/day for 20 years    Types: Cigarettes  . Smokeless tobacco: Never Used  . Alcohol Use: 0.0 oz/week    2-3 Cans of beer per week     Comment: daily     Objective: Filed Vitals:   12/07/13 0940  BP: 129/88  Pulse: 100  Temp: 97.8 F (36.6 C)    General: Alert and Oriented, No Acute Distress HOWEVER APPEARS FATIGUED  HEENT: Pupils equal, round, reactive to light. Conjunctivae clear.  External ears unremarkable, canals clear  with intact TMs with appropriate landmarks.  Middle ear appears open without effusion. Pink inferior turbinates.  Moist mucous membranes, pharynx without inflammation nor lesions.  Neck supple without palpable lymphadenopathy nor abnormal masses. Lungs: Clear to auscultation bilaterally, no wheezing/ronchi/rales.  Comfortable work of breathing. Good air movement. Cardiac: Regular rate and rhythm. Normal S1/S2.  No murmurs, rubs, nor gallops.   Abdomen:  obese soft nontender  Extremities: No peripheral edema.  Strong peripheral pulses.  Mental Status: No depression, anxiety, nor agitation. Skin: Warm and dry.  Assessment & Plan: Laurie [name] was seen today for vomiting and diarrhea.  Diagnoses and associated orders for this visit:  Fever, unspecified - POCT Influenza A/B  Nausea with vomiting - promethazine (PHENERGAN) 50 MG tablet; Take 1 tablet (50 mg total) by mouth every 6 (six) hours as needed for nausea or vomiting.  Diarrhea - diphenoxylate-atropine (LOMOTIL) 2.5-0.025 MG per tablet; Take 1 tablet by mouth 4 (four) times daily as needed for diarrhea or loose stools.    Rapid flu negative high suspicion for viral gastroenteritis extremely low suspicion for bacterial involvement, start promethazine and Imodium may increase to Lomotil if needed. Focus on small frequent sips of Pedialyte advancing to bland diet once the weekend arrives  Return if symptoms worsen or fail to improve.

## 2013-12-07 NOTE — Patient Instructions (Signed)
Viral Gastroenteritis Viral gastroenteritis is also known as stomach flu. This condition affects the stomach and intestinal tract. It can cause sudden diarrhea and vomiting. The illness typically lasts 3 to 8 days. Most people develop an immune response that eventually gets rid of the virus. While this natural response develops, the virus can make you quite ill. CAUSES  Many different viruses can cause gastroenteritis, such as rotavirus or noroviruses. You can catch one of these viruses by consuming contaminated food or water. You may also catch a virus by sharing utensils or other personal items with an infected person or by touching a contaminated surface. SYMPTOMS  The most common symptoms are diarrhea and vomiting. These problems can cause a severe loss of body fluids (dehydration) and a body salt (electrolyte) imbalance. Other symptoms may include:  Fever.  Headache.  Fatigue.  Abdominal pain. DIAGNOSIS  Your caregiver can usually diagnose viral gastroenteritis based on your symptoms and a physical exam. A stool sample may also be taken to test for the presence of viruses or other infections. TREATMENT  This illness typically goes away on its own. Treatments are aimed at rehydration. The most serious cases of viral gastroenteritis involve vomiting so severely that you are not able to keep fluids down. In these cases, fluids must be given through an intravenous line (IV). HOME CARE INSTRUCTIONS   Drink enough fluids to keep your urine clear or pale yellow. Drink small amounts of fluids frequently and increase the amounts as tolerated.  Ask your caregiver for specific rehydration instructions.  Avoid:  Foods high in sugar.  Alcohol.  Carbonated drinks.  Tobacco.  Juice.  Caffeine drinks.  Extremely hot or cold fluids.  Fatty, greasy foods.  Too much intake of anything at one time.  Dairy products until 24 to 48 hours after diarrhea stops.  You may consume probiotics.  Probiotics are active cultures of beneficial bacteria. They may lessen the amount and number of diarrheal stools in adults. Probiotics can be found in yogurt with active cultures and in supplements.  Wash your hands well to avoid spreading the virus.  Only take over-the-counter or prescription medicines for pain, discomfort, or fever as directed by your caregiver. Do not give aspirin to children. Antidiarrheal medicines are not recommended.  Ask your caregiver if you should continue to take your regular prescribed and over-the-counter medicines.  Keep all follow-up appointments as directed by your caregiver. SEEK IMMEDIATE MEDICAL CARE IF:   You are unable to keep fluids down.  You do not urinate at least once every 6 to 8 hours.  You develop shortness of breath.  You notice blood in your stool or vomit. This may look like coffee grounds.  You have abdominal pain that increases or is concentrated in one small area (localized).  You have persistent vomiting or diarrhea.  You have a fever.  The patient is a child younger than 3 months, and he or she has a fever.  The patient is a child older than 3 months, and he or she has a fever and persistent symptoms.  The patient is a child older than 3 months, and he or she has a fever and symptoms suddenly get worse.  The patient is a baby, and he or she has no tears when crying. MAKE SURE YOU:   Understand these instructions.  Will watch your condition.  Will get help right away if you are not doing well or get worse. Document Released: 12/14/2005 Document Revised: 03/07/2012 Document Reviewed: 09/30/2011   Exit[employer] Patient Information 2014 [employer]  

## 2014-02-02 ENCOUNTER — Encounter: Payer: Self-pay | Admitting: Physician Assistant

## 2014-02-02 DIAGNOSIS — G4733 Obstructive sleep apnea (adult) (pediatric): Secondary | ICD-10-CM | POA: Insufficient documentation

## 2014-02-20 ENCOUNTER — Ambulatory Visit (INDEPENDENT_AMBULATORY_CARE_PROVIDER_SITE_OTHER): Payer: PRIVATE HEALTH INSURANCE | Admitting: Family Medicine

## 2014-02-20 ENCOUNTER — Encounter: Payer: Self-pay | Admitting: Family Medicine

## 2014-02-20 VITALS — BP 117/82 | HR 102 | Temp 97.8°F | Wt 257.0 lb

## 2014-02-20 DIAGNOSIS — A499 Bacterial infection, unspecified: Secondary | ICD-10-CM

## 2014-02-20 DIAGNOSIS — R82998 Other abnormal findings in urine: Secondary | ICD-10-CM

## 2014-02-20 DIAGNOSIS — J329 Chronic sinusitis, unspecified: Secondary | ICD-10-CM

## 2014-02-20 DIAGNOSIS — B9689 Other specified bacterial agents as the cause of diseases classified elsewhere: Secondary | ICD-10-CM

## 2014-02-20 DIAGNOSIS — R8290 Unspecified abnormal findings in urine: Secondary | ICD-10-CM

## 2014-02-20 LAB — POCT URINALYSIS DIPSTICK
BILIRUBIN UA: NEGATIVE
GLUCOSE UA: NEGATIVE
Ketones, UA: NEGATIVE
LEUKOCYTES UA: NEGATIVE
NITRITE UA: NEGATIVE
Protein, UA: NEGATIVE
RBC UA: NEGATIVE
Spec Grav, UA: 1.01
UROBILINOGEN UA: 0.2
pH, UA: 6

## 2014-02-20 MED ORDER — AMOXICILLIN-POT CLAVULANATE 500-125 MG PO TABS
ORAL_TABLET | ORAL | Status: AC
Start: 2014-02-20 — End: 2014-03-02

## 2014-02-20 NOTE — Progress Notes (Signed)
CC: Laurie [name] is a 38 y.o. female is here for LLQ pain   Subjective: HPI:  Complains of headache and facial pressure localized to the forehead and beneath both eyes it has been present for the past week worsening on a daily basis. Described only as pressure and nonradiating. Nothing particularly makes it worse slightly improved with ibuprofen and Midol. Has been accompanied by moderate to severe subjective postnasal drip and mild sore throat with moderate fatigue and moderate body aches. Over the weekend she's noticed cloudy urine with urinary frequency but no dysuria urgency or hesitancy. Reports chills but denies fevers confusion or night sweats. Has had some left flank pain and left low back pain that is worse with twisting movements or lying to the left side not related to dietary or bowel habits. Denies constipation, diarrhea nor blood in stool. Denies nausea, vomiting, genitourinary complaints other than that described above.   Review Of Systems Outlined In HPI  Past Medical History  Diagnosis Date  . Obesity   . Endometriosis   . Meningitis     spinal  . History of kidney stones 08/25/2013    Past Surgical History  Procedure Laterality Date  . Abdominal hysterectomy    . Tonsillectomy    . Cholecystectomy    . Bilateral tubal       x2  . Ectopic pregnancy surgery     Family History  Problem Relation Age of Onset  . Thyroid disease Mother   . Cancer Paternal Grandfather     colon  . Cancer Maternal Grandfather     colon  . Hyperlipidemia Maternal Grandfather   . Hypertension Maternal Grandfather   . Alcohol abuse Father   . Diabetes Father   . Hyperlipidemia Maternal Grandmother   . Hypertension Maternal Grandmother   . Thyroid disease Maternal Grandmother   . Diabetes Paternal Grandmother     History   Social History  . Marital Status: Married    Spouse Name: N/A    Number of Children: N/A  . Years of Education: N/A   Occupational History  . Not on  file.   Social History Main Topics  . Smoking status: Former Smoker -- 2.00 packs/day for 20 years    Types: Cigarettes  . Smokeless tobacco: Never Used  . Alcohol Use: 0.0 oz/week    2-3 Cans of beer per week     Comment: daily  . Drug Use: No  . Sexual Activity: Yes    Partners: Male    Birth Control/ Protection: Surgical   Other Topics Concern  . Not on file   Social History Narrative  . No narrative on file     Objective: BP 117/82  Pulse 102  Temp(Src) 97.8 F (36.6 C) (Oral)  Wt 257 lb (116.574 kg)  General: Alert and Oriented, No Acute Distress HEENT: Pupils equal, round, reactive to light. Conjunctivae clear.  External ears unremarkable, canals clear with intact TMs with appropriate landmarks.  Middle ear appears open without effusion. Pink inferior turbinates.  Moist mucous membranes, pharynx without inflammation nor lesions however moderate cobblestoning and postnasal drip.  Neck supple without palpable lymphadenopathy nor abnormal masses. Lungs: Clear to auscultation bilaterally, no wheezing/ronchi/rales.  Comfortable work of breathing. Good air movement. Cardiac: Regular rate and rhythm. Normal S1/S2.  No murmurs, rubs, nor gallops.   Back: No CVA tenderness bilaterally, pain in low back is reproduced with palpation of lower left paralumbar musculature. Extremities: No peripheral edema.  Strong peripheral pulses.  Mental  Status: No depression, anxiety, nor agitation. Skin: Warm and dry.  Assessment & Plan: [name] was seen today for llq pain.  Diagnoses and associated orders for this visit:  Cloudy urine - Urinalysis Dipstick - Urine Culture  Bacterial sinusitis - amoxicillin-clavulanate (AUGMENTIN) 500-125 MG per tablet; Take one by mouth every 8 hours for ten total days.    Cloudy urine: Reassurance provided no sign of urinary tract infection we will follow culture however.  Bacterial sinusitis: Start Augmentin encouraged to use ibuprofen 800 mg 3  times a day as needed including nasal saline washes  25 minutes spent face-to-face during visit today of which at least 50% was counseling or coordinating care regarding: 1. Cloudy urine   2. Bacterial sinusitis       Return if symptoms worsen or fail to improve.

## 2014-02-22 LAB — URINE CULTURE
COLONY COUNT: NO GROWTH
ORGANISM ID, BACTERIA: NO GROWTH

## 2014-02-28 ENCOUNTER — Encounter: Payer: Self-pay | Admitting: Family Medicine

## 2014-05-29 ENCOUNTER — Encounter: Payer: Self-pay | Admitting: Nurse Practitioner

## 2014-05-29 ENCOUNTER — Ambulatory Visit (INDEPENDENT_AMBULATORY_CARE_PROVIDER_SITE_OTHER): Payer: BC Managed Care – PPO | Admitting: Nurse Practitioner

## 2014-05-29 VITALS — Ht 69.0 in | Wt 258.0 lb

## 2014-05-29 DIAGNOSIS — B373 Candidiasis of vulva and vagina: Secondary | ICD-10-CM

## 2014-05-29 DIAGNOSIS — B3731 Acute candidiasis of vulva and vagina: Secondary | ICD-10-CM

## 2014-05-29 DIAGNOSIS — N898 Other specified noninflammatory disorders of vagina: Secondary | ICD-10-CM

## 2014-05-29 DIAGNOSIS — N63 Unspecified lump in unspecified breast: Secondary | ICD-10-CM

## 2014-05-29 MED ORDER — FLUCONAZOLE 150 MG PO TABS: 150.0000 mg | ORAL_TABLET | Freq: Once | ORAL | Status: AC

## 2014-05-29 NOTE — Patient Instructions (Signed)
   MEDICATIONS:  Taking Vitamin E capsules twice a day along with Evening of Primrose Capsules three times a day, or as directed on the bottle, may help your symptoms.  These are both available over-the-counter and without a prescription. There is clinical evidence that these may help symptoms in some patients.   If your physician has prescribed medication for your fibrocystic breast disease, be sure to take it as instructed on the bottle and let him/her know if you have any side effects.  QUESTIONS:  Please feel free to call your physician  if you have any questions, and they will be glad to assist you.

## 2014-05-29 NOTE — Progress Notes (Signed)
History:  Laurie [name] is a 38 y.o. [id number] who presents to [facility] today for 2 problems. First is a tender right  breast lump that she noticed 2-3 weeks ago. She had a similar lump 8 years ago and was diagnosed with cyst. She has no first degree family member with breast cancer. She quit smoking in 2009. She stopped her estrogen replacement one year ago. She has had a TAH.. The second problem is of vaginal yeast infection. She would like Diflucan.  The following portions of the patient's history were reviewed and updated as appropriate: allergies, current medications, past family history, past medical history, past social history, past surgical history and problem list.  Review of Systems:  Pertinent items are noted in HPI.  Objective:  Physical Exam Ht 5\' 9"  (1.753 m)  Wt 258 lb (117.028 kg)  BMI 38.08 kg/m2 GENERAL: Well-developed, well-nourished female in no acute distress. Obese HEENT: Normocephalic, atraumatic.  NECK: Supple. Normal thyroid.  BREASTS: Symmetric in size. No skin changes, nipple drainage, or lymphadenopathy. Right breast at 4 o'clock in median side there is a 1cm tender mass. ABDOMEN: Soft, nontender, nondistended. No organomegaly. Normal bowel sounds appreciated in all quadrants.  PELVIC: Normal external female genitalia. Vagina is pink and rugated. Small amount white discharge. Cervix absent.  EXTREMITIES: No cyanosis, clubbing, or edema, 2+ distal pulses.   Labs and Imaging No results found.  Assessment & Plan:  Assessment:  Right breast mass Vaginal Discharge  Plans: [facility]er to [facility] for evaluation Wet prep Diflucan 150 mg / 1 refill  Delbert Phenix, NP 05/29/2014 11:47 AM

## 2014-05-30 ENCOUNTER — Telehealth: Payer: Self-pay | Admitting: *Deleted

## 2014-05-30 DIAGNOSIS — N760 Acute vaginitis: Principal | ICD-10-CM

## 2014-05-30 DIAGNOSIS — B9689 Other specified bacterial agents as the cause of diseases classified elsewhere: Secondary | ICD-10-CM

## 2014-05-30 LAB — WET PREP, GENITAL
Trich, Wet Prep: NONE SEEN
WBC WET PREP: NONE SEEN
YEAST WET PREP: NONE SEEN

## 2014-05-30 MED ORDER — METRONIDAZOLE 500 MG PO TABS: 500.0000 mg | ORAL_TABLET | Freq: Two times a day (BID) | ORAL | Status: DC

## 2014-05-30 NOTE — Telephone Encounter (Signed)
LM on voicemail of positive clue cells on wet prep and RX sent to [facility]

## 2014-06-13 ENCOUNTER — Ambulatory Visit
Admission: RE | Admit: 2014-06-13 | Discharge: 2014-06-13 | Disposition: A | Discharge status: Home / Self Care | Payer: BC Managed Care – PPO | Source: Ambulatory Visit | Attending: Nurse Practitioner | Admitting: Nurse Practitioner

## 2014-06-13 DIAGNOSIS — N63 Unspecified lump in unspecified breast: Secondary | ICD-10-CM

## 2014-08-11 IMAGING — MG MM DIGITAL DIAGNOSTIC BILAT
8 series · 8 of 8 positions shown · non-contrast
Comparison: None.

CLINICAL DATA: Patient is a 37-year-old female with a palpable
concern in the outer right breast. She also reports tenderness in
right breast.

EXAM:
DIGITAL DIAGNOSTIC  BILATERAL MAMMOGRAM WITH CAD
ULTRASOUND RIGHT BREAST

[L CC]
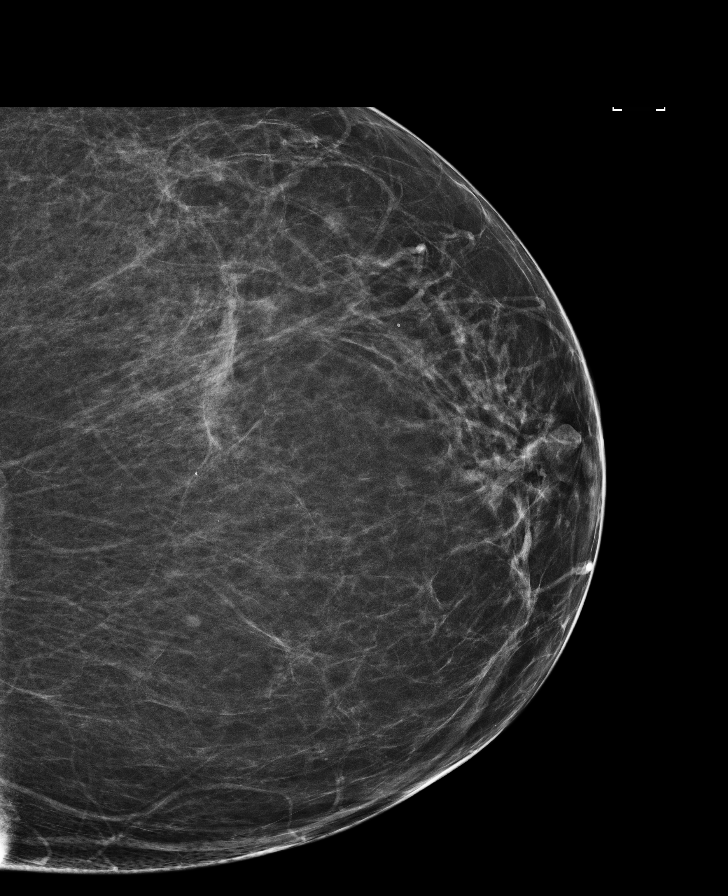

[L MLO (1 of 2)]
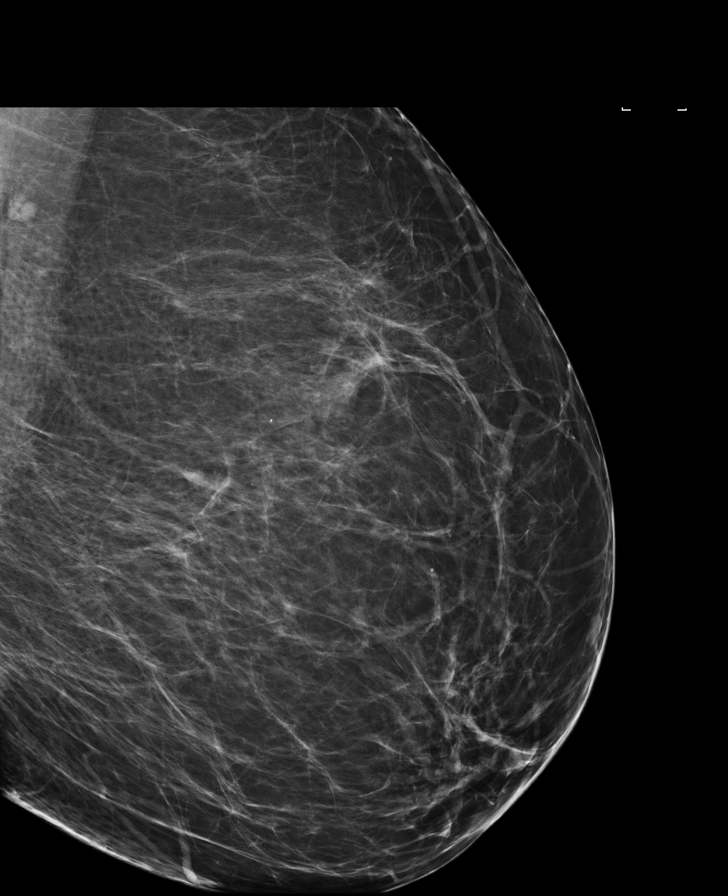

[R MLO (1 of 2)]
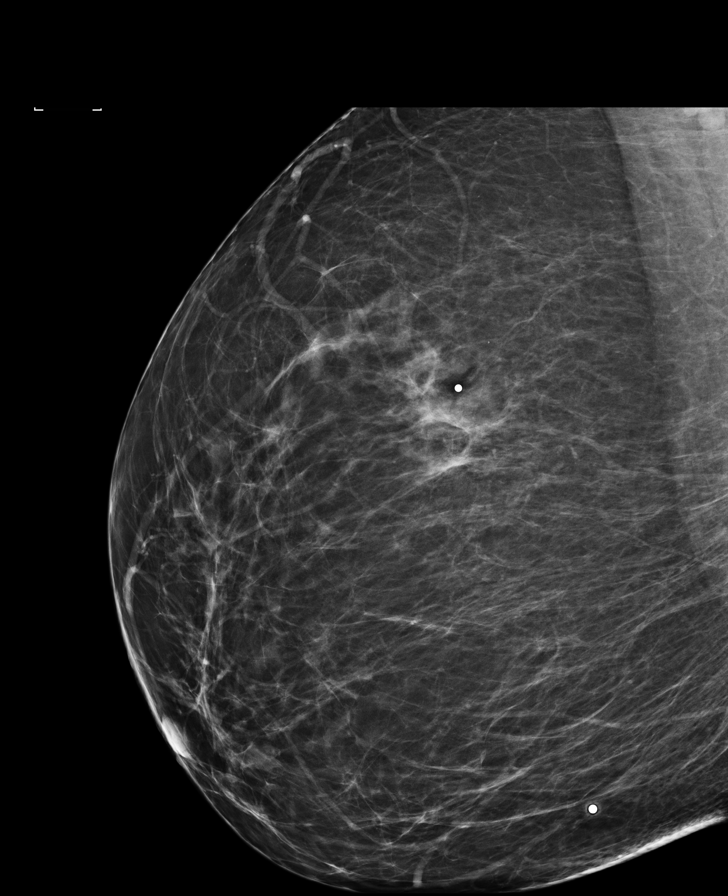

[R MLO (2 of 2)]
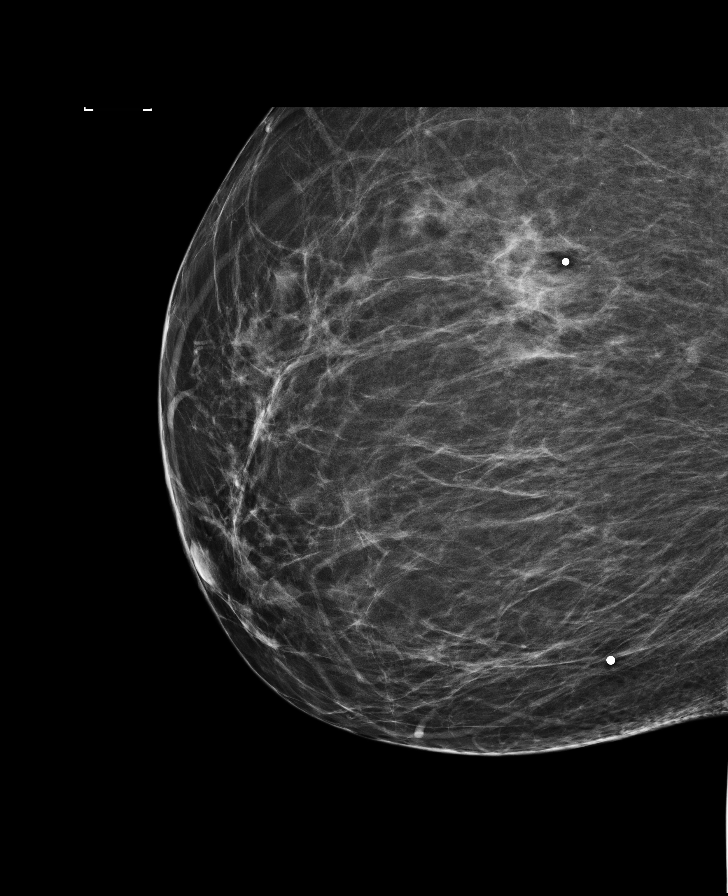

[R TAN (1 of 2)]
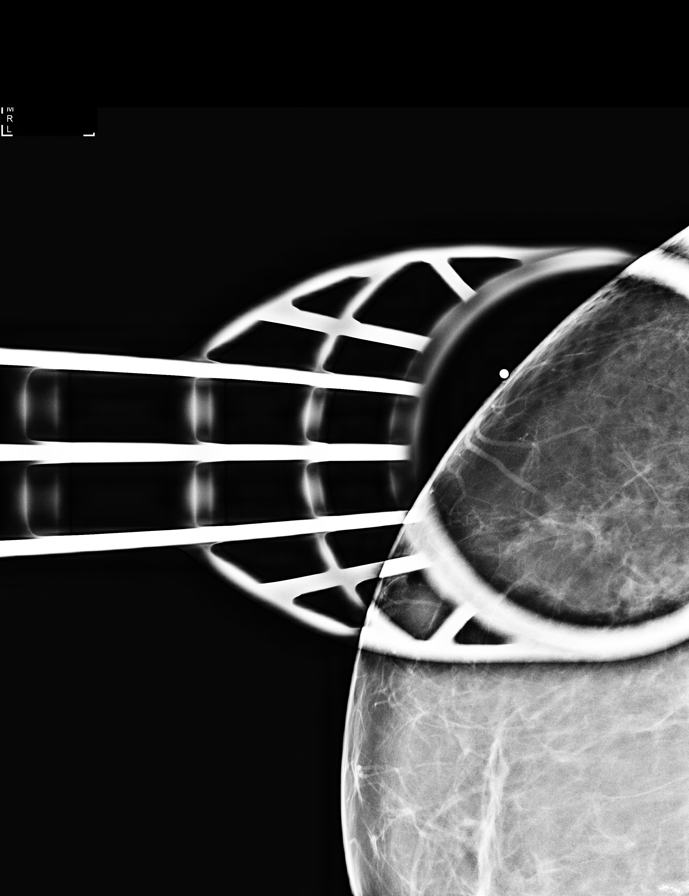

[R CC]
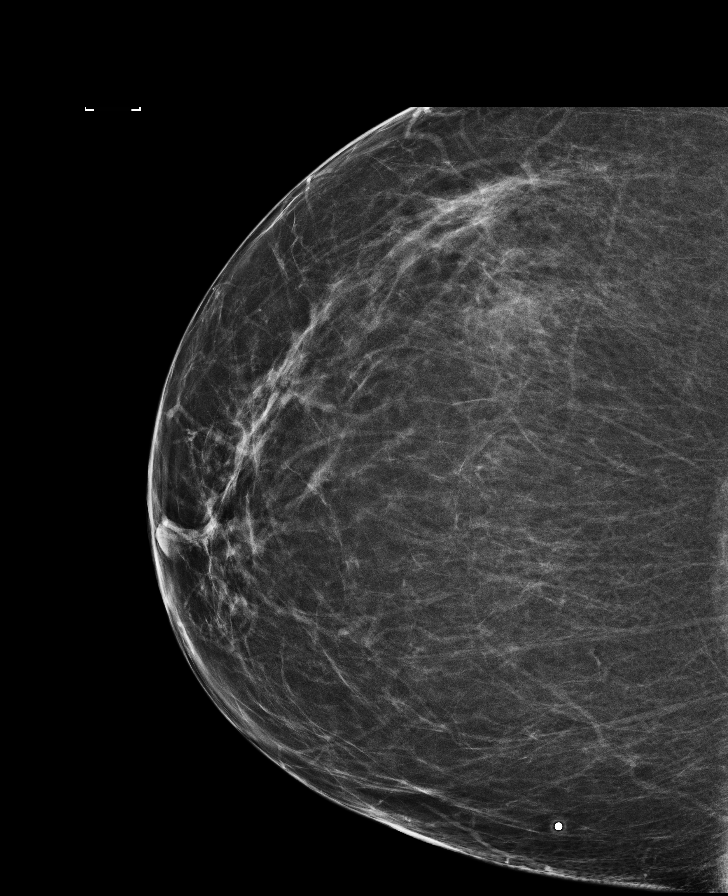

[R TAN (2 of 2)]
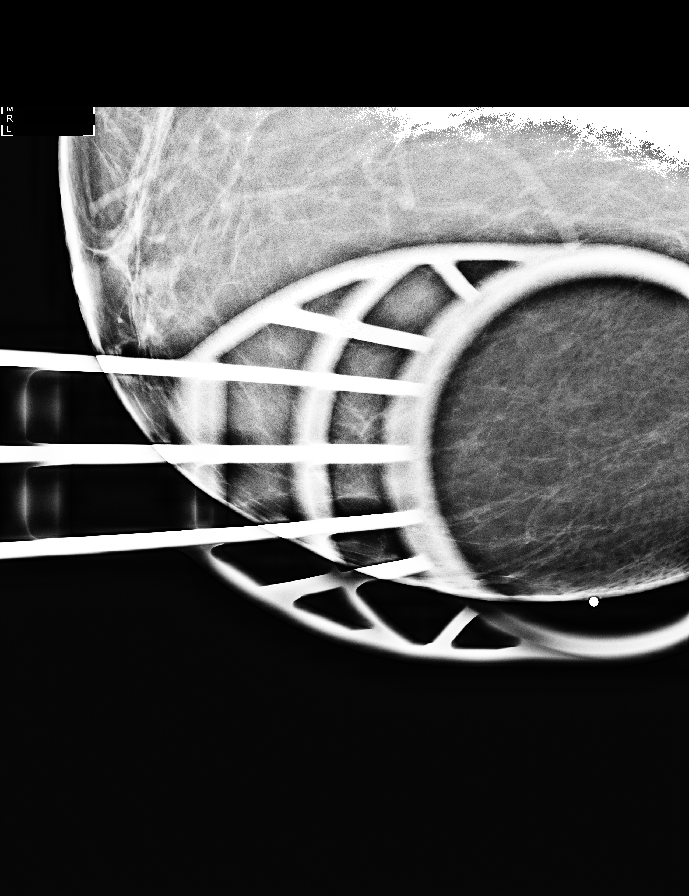

[L MLO (2 of 2)]
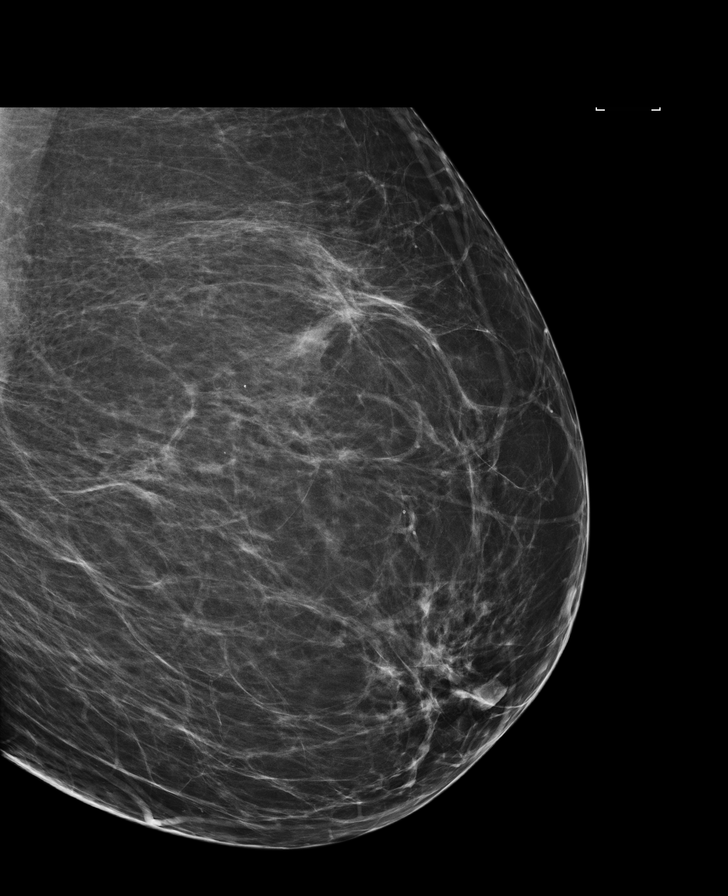

[8 of 8 positions shown; findings below may reference images not displayed]

ACR Breast Density Category b: There are scattered areas of
fibroglandular density.
FINDINGS: Digital bilateral diagnostic mammography demonstrates scattered
fibroglandular densities. Metallic BBs have been placed overlying
the outer right breast and inner right breast denoting the regions
of concern as indicated by the patient. Spot-compression views in
the tangential projection reveal effacement are normal
fibroglandular tissue. No abnormal mass or suspicious
microcalcifications.

Mammographic images were processed with CAD.

On physical exam, no palpable masses are identified.

Ultrasound is performed in the regions of concern as indicated by
the patient, showing normal fibroglandular tissue. No conspicuous
cystic or solid mass.
IMPRESSION: 1. No mammographic findings of malignancy.
2. No sonographic findings of malignancy in the right breast.
3. No imaging abnormality in the right breast to account for a
palpable mass.

RECOMMENDATION:
1. Screening mammogram at age 40 unless there are persistent or
intervening clinical concerns. (Code:T4-T-0HC)
2. No imaging abnormality in the right breast to account for a
palpable mass. Recommend continued clinical management including
follow-up physical exam to determine if additional diagnostic
imaging is indicated.

I have discussed the findings and recommendations with the patient
and the importance of clinical follow-up was stressed. Results were
also provided in writing at the conclusion of the visit. If
applicable, a reminder letter will be sent to the patient regarding
the next appointment.

BI-RADS CATEGORY  1: Negative.

## 2014-09-06 ENCOUNTER — Ambulatory Visit (INDEPENDENT_AMBULATORY_CARE_PROVIDER_SITE_OTHER): Payer: BC Managed Care – PPO | Admitting: Family Medicine

## 2014-09-06 ENCOUNTER — Encounter: Payer: Self-pay | Admitting: Family Medicine

## 2014-09-06 VITALS — BP 138/84 | HR 90 | Wt 254.0 lb

## 2014-09-06 DIAGNOSIS — Z9109 Other allergy status, other than to drugs and biological substances: Secondary | ICD-10-CM

## 2014-09-06 MED ORDER — CETIRIZINE HCL 10 MG PO TABS: 10.0000 mg | ORAL_TABLET | Freq: Every day | ORAL | Status: DC

## 2014-09-06 MED ORDER — MONTELUKAST SODIUM 10 MG PO TABS: 10.0000 mg | ORAL_TABLET | Freq: Every day | ORAL | Status: DC

## 2014-09-06 NOTE — Progress Notes (Signed)
CC: Laurie [name] is a 38 y.o. female is here for Allergies   Subjective: HPI:  Complains of itchy eyes, runny nose, sneezing, and sore throat that occurs whenever she is around her new Laurie [name] when she is around hay.  Symptoms are moderate in severity but absent when she is not around the above environments. Interventions have included taking over-the-counter antihistamines with only mild improvement. Symptoms have been present for matter of years but only worsened recently due to a new dog and now significantly interfering with quality of life. No other interventions as of yet.  Denies shortness of breath, wheezing, cough, fevers, chills, motor or sensory disturbances, itching or skin lesions.   Review Of Systems Outlined In HPI  Past Medical History  Diagnosis Date  . Obesity   . Endometriosis   . Meningitis     spinal  . History of kidney stones 08/25/2013    Past Surgical History  Procedure Laterality Date  . Abdominal hysterectomy    . Tonsillectomy    . Cholecystectomy    . Bilateral tubal       x2  . Ectopic pregnancy surgery     Family History  Problem Relation Age of Onset  . Thyroid disease Mother   . Cancer Paternal Grandfather     colon  . Cancer Maternal Grandfather     colon  . Hyperlipidemia Maternal Grandfather   . Hypertension Maternal Grandfather   . Alcohol abuse Father   . Diabetes Father   . Hyperlipidemia Maternal Grandmother   . Hypertension Maternal Grandmother   . Thyroid disease Maternal Grandmother   . Diabetes Paternal Grandmother     History   Social History  . Marital Status: Married    Spouse Name: N/A    Number of Children: N/A  . Years of Education: N/A   Occupational History  . Not on file.   Social History Main Topics  . Smoking status: Former Smoker -- 2.00 packs/day for 20 years    Types: Cigarettes  . Smokeless tobacco: Never Used  . Alcohol Use: 0.0 oz/week    2-3 Cans of beer per week     Comment: daily  .  Drug Use: No  . Sexual Activity: Yes    Partners: Male    Birth Control/ Protection: Surgical   Other Topics Concern  . Not on file   Social History Narrative  . No narrative on file     Objective: BP 138/84  Pulse 90  Wt 254 lb (115.214 kg)  General: Alert and Oriented, No Acute Distress HEENT: Pupils equal, round, reactive to light. Conjunctivae clear.  External ears unremarkable, canals clear with intact TMs with appropriate landmarks.  Middle ear appears open without effusion. Pink inferior turbinates.  Moist mucous membranes, pharynx without inflammation nor lesions.  Neck supple without palpable lymphadenopathy nor abnormal masses. Lungs: Clear to auscultation bilaterally, no wheezing/ronchi/rales.  Comfortable work of breathing. Good air movement. Extremities: No peripheral edema.  Strong peripheral pulses.  Mental Status: No depression, anxiety, nor agitation. Skin: Warm and dry.  Assessment & Plan: Laurie [name] was seen today for allergies.  Diagnoses and associated orders for this visit:  Environmental allergies - montelukast (SINGULAIR) 10 MG tablet; Take 1 tablet (10 mg total) by mouth at bedtime. - cetirizine (ZYRTEC) 10 MG tablet; Take 1 tablet (10 mg total) by mouth daily. - Ambulatory referral to Allergy    Encouraged her to start both over-the-counter Zyrtec along with a prescription of Singulair. If this  is ineffective the next step would be to add a nasal corticosteroid. Since the idea of her getting rid of their new dog is out of the question we discussed referral to allergy which she accepted. Time was taken to answer all her questions regarding the possibility of immunotherapy and how this would work to help reduce allergy symptoms in the future.  25 minutes spent face-to-face during visit today of which at least 50% was counseling or coordinating care regarding: 1. Environmental allergies       Return if symptoms worsen or fail to improve.

## 2014-10-18 ENCOUNTER — Encounter: Payer: Self-pay | Admitting: Emergency Medicine

## 2014-10-18 ENCOUNTER — Emergency Department
Admission: EM | Admit: 2014-10-18 | Discharge: 2014-10-18 | Disposition: A | Discharge status: Home / Self Care | Payer: BC Managed Care – PPO | Source: Home / Self Care | Attending: Emergency Medicine | Admitting: Emergency Medicine

## 2014-10-18 DIAGNOSIS — H109 Unspecified conjunctivitis: Secondary | ICD-10-CM

## 2014-10-18 MED ORDER — TOBRAMYCIN 0.3 % OP SOLN: 2.0000 [drp] | OPHTHALMIC | Status: DC

## 2014-10-18 NOTE — ED Notes (Signed)
Woke up this morning with right eye red and irritated, congestion, headache, sneezing

## 2014-10-18 NOTE — Discharge Instructions (Signed)

## 2014-10-18 NOTE — ED Provider Notes (Signed)
CSN: [id number]     Arrival date & time 10/18/14  0807 History   First MD Initiated Contact with Patient 10/18/14 [phone]     Chief Complaint  Patient presents with  . Eye Problem   (Consider location/radiation/quality/duration/timing/severity/associated sxs/prior Treatment) Patient is a 38 y.o. female presenting with eye problem. The history is provided by the patient. No language interpreter was used.  Eye Problem Location:  R eye Quality:  Aching Severity:  Moderate Onset quality:  Gradual Duration:  1 day Timing:  Constant Progression:  Worsening Chronicity:  New Relieved by:  Nothing Worsened by:  Nothing tried Ineffective treatments:  None tried Associated symptoms: itching and redness   Associated symptoms: no blurred vision and no headaches   Risk factors: no conjunctival hemorrhage     Past Medical History  Diagnosis Date  . Obesity   . Endometriosis   . Meningitis     spinal  . History of kidney stones 08/25/2013   Past Surgical History  Procedure Laterality Date  . Abdominal hysterectomy    . Tonsillectomy    . Cholecystectomy    . Bilateral tubal       x2  . Ectopic pregnancy surgery     Family History  Problem Relation Age of Onset  . Thyroid disease Mother   . Cancer Paternal Grandfather     colon  . Cancer Maternal Grandfather     colon  . Hyperlipidemia Maternal Grandfather   . Hypertension Maternal Grandfather   . Alcohol abuse Father   . Diabetes Father   . Hyperlipidemia Maternal Grandmother   . Hypertension Maternal Grandmother   . Thyroid disease Maternal Grandmother   . Diabetes Paternal Grandmother    History  Substance Use Topics  . Smoking status: Former Smoker -- 2.00 packs/day for 20 years    Types: Cigarettes  . Smokeless tobacco: Never Used  . Alcohol Use: 0.0 oz/week    2-3 Cans of beer per week     Comment: daily   OB History   Grav Para Term Preterm Abortions TAB SAB Ect Mult Living   2 1   1   1  2      Review of  Systems  Eyes: Positive for redness and itching. Negative for blurred vision.  Neurological: Negative for headaches.  All other systems reviewed and are negative.   Allergies  Review of patient's allergies indicates no known allergies.  Home Medications   Prior to Admission medications   Medication Sig Start Date End Date Taking? Authorizing Provider  cetirizine (ZYRTEC) 10 MG tablet Take 1 tablet (10 mg total) by mouth daily. 09/06/14   Laren BoomSean Hommel, DO  cholecalciferol (VITAMIN D) 1000 UNITS tablet Take 1,000 Units by mouth daily.    Historical Provider, MD  montelukast (SINGULAIR) 10 MG tablet Take 1 tablet (10 mg total) by mouth at bedtime. 09/06/14   Laren BoomSean Hommel, DO  Multiple Vitamin (MULTIVITAMIN) capsule Take 1 capsule by mouth daily.    Historical Provider, MD  Phentermine-Topiramate (QSYMIA PO) Take by mouth.    Historical Provider, MD  tobramycin (TOBREX) 0.3 % ophthalmic solution Place 2 drops into the right eye every 4 (four) hours. 10/18/14   Elson AreasLeslie K Broadus Costilla, PA-C   BP 115/82  Pulse 86  Temp(Src) 97.9 F (36.6 C) (Oral)  Ht 5\' 11"  (1.803 m)  Wt 259 lb (117.482 kg)  BMI 36.14 kg/m2  SpO2 99% Physical Exam  Nursing note and vitals reviewed. Constitutional: She is oriented to person, place,  and time. She appears well-developed and well-nourished.  HENT:  Head: Normocephalic.  Eyes: Conjunctivae and EOM are normal. Pupils are equal, round, and reactive to light.  injected right conjunctiva, no drainage.  fluro no uptake no foreign boedy  Neck: Normal range of motion.  Pulmonary/Chest: Effort normal.  Abdominal: She exhibits no distension.  Musculoskeletal: Normal range of motion.  Neurological: She is alert and oriented to person, place, and time.  Psychiatric: She has a normal mood and affect.    ED Course  Procedures (including critical care time) Labs Review Labs Reviewed - No data to display  Imaging Review No results found.   MDM   1. Conjunctivitis of  right eye    Tobrex Cool compresses.   OOW x 24 AVS    Lonia SkinnerLeslie K Otter LakeSofia, [place] JerseyPA-C 10/18/14 [phone]

## 2014-10-19 NOTE — ED Provider Notes (Signed)
Medical history/examination/treatment/procedure(s) were performed by non-physician provider and as supervising physician I was immediately available for consultation/collaboration.  David Massey, MD 10/19/14 1246 

## 2014-10-29 ENCOUNTER — Encounter: Payer: Self-pay | Admitting: Emergency Medicine

## 2014-10-30 ENCOUNTER — Ambulatory Visit (INDEPENDENT_AMBULATORY_CARE_PROVIDER_SITE_OTHER): Payer: BC Managed Care – PPO | Admitting: Family Medicine

## 2014-10-30 ENCOUNTER — Encounter: Payer: Self-pay | Admitting: Family Medicine

## 2014-10-30 VITALS — BP 110/80 | HR 96 | Wt 264.0 lb

## 2014-10-30 DIAGNOSIS — Z23 Encounter for immunization: Secondary | ICD-10-CM | POA: Diagnosis not present

## 2014-10-30 DIAGNOSIS — G4700 Insomnia, unspecified: Secondary | ICD-10-CM | POA: Diagnosis not present

## 2014-10-30 DIAGNOSIS — F419 Anxiety disorder, unspecified: Secondary | ICD-10-CM | POA: Diagnosis not present

## 2014-10-30 DIAGNOSIS — Z2839 Other underimmunization status: Secondary | ICD-10-CM

## 2014-10-30 DIAGNOSIS — R41840 Attention and concentration deficit: Secondary | ICD-10-CM | POA: Diagnosis not present

## 2014-10-30 DIAGNOSIS — Z283 Underimmunization status: Secondary | ICD-10-CM

## 2014-10-30 MED ORDER — ZOLPIDEM TARTRATE 5 MG PO TABS: 5.0000 mg | ORAL_TABLET | Freq: Every evening | ORAL | Status: DC | PRN

## 2014-10-30 MED ORDER — ALPRAZOLAM 0.5 MG PO TABS
ORAL_TABLET | ORAL | Status: DC
Start: 2014-10-30 — End: 2015-01-14

## 2014-10-30 NOTE — Progress Notes (Signed)
CC: Laurie [name] is a 38 y.o. female is here for discuss vaccines   Subjective: HPI:  Patient reports that she has acquired a new position that will require her to go to [place] next month and 6 months from now as will have multiple trips to developed and third world countries. She has questions regarding what immunizations she should get in anticipation of these trips. She believes that she has already had hepatitis B, MMR, and had chickenpox as a child.  She reports difficulty with falling asleep when in different time zones, both stain and getting asleep and that she does not believe anxiety, mental disturbance for pain contributes to this.symptoms are moderate to severe in severity and absent at home.  She also reports difficulty with flying due to anxiety if flights are ever greater than 2-3 hours. She was noted there's any medication she can take with her trip to [place] coming out. She is tolerated Xanax in the past with this issue.  She reports a difficulty with concentration and a remote diagnosis of adult ADHD. She's been on medication in the past however forgets the name of it. She wants to know if she can restart this medication and if we can help manage it. She's been off of it for over a year. She is having difficulty with concentration and avoiding distractions of a moderate severity with her new promotion. Symptoms are present on a daily basis at work and at home  [place] HPI  Past Medical History  Diagnosis Date  . Obesity   . Endometriosis   . Meningitis     spinal  . History of kidney stones 08/25/2013    Past Surgical History  Procedure Laterality Date  . Abdominal hysterectomy    . Tonsillectomy    . Cholecystectomy    . Bilateral tubal       x2  . Ectopic pregnancy surgery     Family History  Problem Relation Age of Onset  . Thyroid disease Mother   . Cancer Paternal Grandfather     colon  . Cancer Maternal Grandfather     colon  .  Hyperlipidemia Maternal Grandfather   . Hypertension Maternal Grandfather   . Alcohol abuse Father   . Diabetes Father   . Hyperlipidemia Maternal Grandmother   . Hypertension Maternal Grandmother   . Thyroid disease Maternal Grandmother   . Diabetes Paternal Grandmother     History   Social History  . Marital Status: Married    Spouse Name: N/A    Number of Children: N/A  . Years of Education: N/A   Occupational History  . Not on file.   Social History Main Topics  . Smoking status: Former Smoker -- 2.00 packs/day for 20 years    Types: Cigarettes  . Smokeless tobacco: Never Used  . Alcohol Use: 0.0 oz/week    2-3 Cans of beer per week     Comment: daily  . Drug Use: No  . Sexual Activity:    Partners: Male    Birth Control/ Protection: Surgical   Other Topics Concern  . Not on file   Social History Narrative     Objective: BP 110/80 mmHg  Pulse 96  Wt 264 lb (119.75 kg)  Vital signs reviewed. General: Alert and Oriented, No Acute Distress HEENT: Pupils equal, round, reactive to light. Conjunctivae clear.  External ears unremarkable.  Moist mucous membranes. Lungs: Clear and comfortable work of breathing, speaking in full sentences without  accessory muscle use. Cardiac: Regular rate and rhythm.  Neuro: CN II-XII grossly intact, gait normal. Extremities: No peripheral edema.  Strong peripheral pulses.  Mental Status: No depression, anxiety, nor agitation. Logical though process. Skin: Warm and dry.  Assessment & Plan: Laurie [name] was seen today for discuss vaccines.  Diagnoses and associated orders for this visit:  Insomnia - zolpidem (AMBIEN) 5 MG tablet; Take 1 tablet (5 mg total) by mouth at bedtime as needed for sleep.  Anxiety - ALPRAZolam (XANAX) 0.5 MG tablet; One to two by mouth every six hours as needed for flight anxiety.  Poor concentration  Immunization deficiency - Hepatitis A vaccine adult IM - Tdap vaccine greater than or equal to 7yo  IM    Insomnia: Start as needed Ambien while "on the road" Anxiety: Provided with Xanax to be used only for flight anxiety For concentration: Have asked her to provide me with her old records from her former behavioral health provider and I will consider restarting whatever medication she was on in the past based on record review. Travel medicine: Encouraged her to get the flu shot she has declined. She is due for hepatitis A and to return in 6-12 months for second  Immunization. She was agreeable to Tdap today and will consider typhoid vaccine with the advice that she would need to get this 6-8 weeks prior to travel.  25 minutes spent face-to-face during visit today of which at least 50% was counseling or coordinating care regarding: 1. Insomnia   2. Anxiety   3. Poor concentration   4. Immunization deficiency       Return in about 6 months (around 04/30/2015) for Second and final Hep A Vaccine.

## 2014-12-06 ENCOUNTER — Ambulatory Visit (INDEPENDENT_AMBULATORY_CARE_PROVIDER_SITE_OTHER): Payer: BC Managed Care – PPO

## 2014-12-06 ENCOUNTER — Ambulatory Visit (INDEPENDENT_AMBULATORY_CARE_PROVIDER_SITE_OTHER): Payer: BC Managed Care – PPO | Admitting: Family Medicine

## 2014-12-06 ENCOUNTER — Encounter: Payer: Self-pay | Admitting: Family Medicine

## 2014-12-06 VITALS — BP 114/79 | HR 81 | Temp 97.4°F | Wt 269.0 lb

## 2014-12-06 DIAGNOSIS — M609 Myositis, unspecified: Secondary | ICD-10-CM | POA: Diagnosis not present

## 2014-12-06 DIAGNOSIS — R0602 Shortness of breath: Secondary | ICD-10-CM

## 2014-12-06 DIAGNOSIS — R509 Fever, unspecified: Secondary | ICD-10-CM | POA: Diagnosis not present

## 2014-12-06 DIAGNOSIS — M791 Myalgia: Secondary | ICD-10-CM

## 2014-12-06 DIAGNOSIS — IMO0001 Reserved for inherently not codable concepts without codable children: Secondary | ICD-10-CM

## 2014-12-06 NOTE — Progress Notes (Signed)
   Subjective:    Patient ID: Laurie [name], female    DOB: [birth year], 38 y.o.   MRN: [id number]  HPI 4 days of bodyaches, headache.  Feels like maybe some fever the last couple of days.  + sneezing.  No cough. No sinus paing or congestion.  No nausea. Says using OTC cough and cold medication. No hx of asthma. No smoking. Says when tries to take a deep breath feels like someone is hugging her to tight.     Review of Systems     Objective:   Physical Exam  Constitutional: She is oriented to person, place, and time. She appears well-developed and well-nourished.  HENT:  Head: Normocephalic and atraumatic.  Right Ear: External ear normal.  Left Ear: External ear normal.  Nose: Nose normal.  Mouth/Throat: Oropharynx is clear and moist.  TMs and canals are clear.   Eyes: Conjunctivae and EOM are normal. Pupils are equal, round, and reactive to light.  Neck: Neck supple. No thyromegaly present.  Cardiovascular: Normal rate, regular rhythm and normal heart sounds.   Pulmonary/Chest: Effort normal and breath sounds normal. She has no wheezes.  Lymphadenopathy:    She has no cervical adenopathy.  Neurological: She is alert and oriented to person, place, and time.  Skin: Skin is warm and dry.  Psychiatric: She has a normal mood and affect.          Assessment & Plan:  Fever/headache/shortest breath-most likely viral illness though with the chest tightness and shortness of breath and would like to get chest x-ray just to rule out pneumonia. We'll call results later today. If it's negative then we'll treat his thyroid illness and try to get it through the weekend with pain relievers and rest and hydration and see if symptoms resolve on her own..Marland Kitchen

## 2015-01-14 ENCOUNTER — Ambulatory Visit (INDEPENDENT_AMBULATORY_CARE_PROVIDER_SITE_OTHER): Payer: BLUE CROSS/BLUE SHIELD | Admitting: Family Medicine

## 2015-01-14 ENCOUNTER — Encounter: Payer: Self-pay | Admitting: Family Medicine

## 2015-01-14 VITALS — BP 127/81 | HR 93 | Wt 271.0 lb

## 2015-01-14 DIAGNOSIS — G4700 Insomnia, unspecified: Secondary | ICD-10-CM | POA: Diagnosis not present

## 2015-01-14 DIAGNOSIS — F419 Anxiety disorder, unspecified: Secondary | ICD-10-CM

## 2015-01-14 DIAGNOSIS — Z630 Problems in relationship with spouse or partner: Secondary | ICD-10-CM | POA: Diagnosis not present

## 2015-01-14 MED ORDER — ALPRAZOLAM 0.5 MG PO TABS: ORAL_TABLET | ORAL | Status: DC

## 2015-01-14 MED ORDER — ZOLPIDEM TARTRATE 10 MG PO TABS: 10.0000 mg | ORAL_TABLET | Freq: Every evening | ORAL | Status: AC | PRN

## 2015-01-14 NOTE — Progress Notes (Signed)
CC: Laurie [name] is a 39 y.o. female is here for Follow-up   Subjective: HPI:  Follow-up anxiety: She tells me that the only thing causing her anxiety these days is plane flights. She's been on multiple flights since I saw her last and required Xanax just prior to boarding the airplane. Provided she takes this medication just prior to boarding she denies any anxiety affects quality of life. She denies any known side effects or intolerance. Denies any other mental disturbance other than above  Follow-up insomnia: She found that one 5 mg tablet of Ambien was not effective but a total of 10 mg before bedtime was moderately effective with difficulty falling asleep and staying asleep. She's been using this to help with jet lag. She's taken a few doses while at home and her husband denies any known sleepwalking or nocturnal behavior. She tells me she feels well rested after taking this medication. Denies any known side effects  The requesting a referral to a marriage counselor to help with their communication skills. There is nothing in particular that's causing conflict other than Medication breakdown.    Review Of Systems Outlined In HPI  Past Medical History  Diagnosis Date  . Obesity   . Endometriosis   . Meningitis     spinal  . History of kidney stones 08/25/2013    Past Surgical History  Procedure Laterality Date  . Abdominal hysterectomy    . Tonsillectomy    . Cholecystectomy    . Bilateral tubal       x2  . Ectopic pregnancy surgery     Family History  Problem Relation Age of Onset  . Thyroid disease Mother   . Cancer Paternal Grandfather     colon  . Cancer Maternal Grandfather     colon  . Hyperlipidemia Maternal Grandfather   . Hypertension Maternal Grandfather   . Alcohol abuse Father   . Diabetes Father   . Hyperlipidemia Maternal Grandmother   . Hypertension Maternal Grandmother   . Thyroid disease Maternal Grandmother   . Diabetes Paternal Grandmother      History   Social History  . Marital Status: Married    Spouse Name: N/A    Number of Children: N/A  . Years of Education: N/A   Occupational History  . Not on file.   Social History Main Topics  . Smoking status: Former Smoker -- 2.00 packs/day for 20 years    Types: Cigarettes  . Smokeless tobacco: Never Used  . Alcohol Use: 0.0 oz/week    2-3 Cans of beer per week     Comment: daily  . Drug Use: No  . Sexual Activity:    Partners: Male    Birth Control/ Protection: Surgical   Other Topics Concern  . Not on file   Social History Narrative     Objective: BP 127/81 mmHg  Pulse 93  Wt 271 lb (122.925 kg)  General: Alert and Oriented, No Acute Distress HEENT: Pupils equal, round, reactive to light. Conjunctivae clear.  Moist mucous membranes Lungs: Clear to auscultation bilaterally, no wheezing/ronchi/rales.  Comfortable work of breathing. Good air movement. Cardiac: Regular rate and rhythm. Normal S1/S2.  No murmurs, rubs, nor gallops.   Extremities: No peripheral edema.  Strong peripheral pulses.  Mental Status: No depression, anxiety, nor agitation. Skin: Warm and dry.  Assessment & Plan: Laurie [name] was seen today for follow-up.  Diagnoses and associated orders for this visit:  Anxiety - ALPRAZolam (XANAX) 0.5 MG tablet; One to  two by mouth every six hours as needed for flight anxiety.  Insomnia - zolpidem (AMBIEN) 10 MG tablet; Take 1 tablet (10 mg total) by mouth at bedtime as needed for sleep.  Marital conflict - Ambulatory referral to Psychology    Anxiety: Controlled, continue Xanax for only for flight anxiety Insomnia: Controlled on 10 mg formulation of Ambien Marital conflict: Referral has been placed  Return if symptoms worsen or fail to improve.

## 2015-01-28 ENCOUNTER — Ambulatory Visit (INDEPENDENT_AMBULATORY_CARE_PROVIDER_SITE_OTHER): Payer: BLUE CROSS/BLUE SHIELD | Admitting: Obstetrics & Gynecology

## 2015-01-28 ENCOUNTER — Encounter: Payer: Self-pay | Admitting: Obstetrics & Gynecology

## 2015-01-28 VITALS — BP 137/95 | HR 95 | Resp 16 | Ht 69.0 in | Wt 280.0 lb

## 2015-01-28 DIAGNOSIS — N938 Other specified abnormal uterine and vaginal bleeding: Secondary | ICD-10-CM | POA: Diagnosis not present

## 2015-01-28 DIAGNOSIS — A6004 Herpesviral vulvovaginitis: Secondary | ICD-10-CM

## 2015-01-28 DIAGNOSIS — N926 Irregular menstruation, unspecified: Secondary | ICD-10-CM

## 2015-01-29 ENCOUNTER — Telehealth: Payer: Self-pay | Admitting: *Deleted

## 2015-01-29 LAB — WET PREP FOR TRICH, YEAST, CLUE
CLUE CELLS WET PREP: NONE SEEN
Trich, Wet Prep: NONE SEEN
Yeast Wet Prep HPF POC: NONE SEEN

## 2015-01-29 LAB — HSV 2 ANTIBODY, IGG: HSV 2 Glycoprotein G Ab, IgG: 14.51 IV — ABNORMAL HIGH

## 2015-01-29 LAB — TSH: TSH: 2.701 u[IU]/mL (ref 0.350–4.500)

## 2015-01-29 NOTE — Telephone Encounter (Signed)
Lm of normal wet prep.  Pt to call office when she does the challenge test that she and Dr Marice Potterove discussed yesterday.

## 2015-01-30 ENCOUNTER — Telehealth: Payer: Self-pay | Admitting: *Deleted

## 2015-01-30 DIAGNOSIS — R768 Other specified abnormal immunological findings in serum: Secondary | ICD-10-CM

## 2015-01-30 MED ORDER — VALACYCLOVIR HCL 1 G PO TABS: 1000.0000 mg | ORAL_TABLET | Freq: Two times a day (BID) | ORAL | Status: AC

## 2015-01-30 NOTE — Telephone Encounter (Signed)
Pt notified of positive HSV 2 and RX was sent to [facility] in [place] for Vatrex per Dr Marice Potterove.

## 2015-02-01 NOTE — Progress Notes (Signed)
   Subjective:    Patient ID: Laurie [name], female    DOB: 11-10-1976, 39 y.o.   MRN: [id number]  HPI This 39 yo lady who has had a hysterectomy is here today because over the last 2 weeks she has seen some "vaginal" blood with wiping.    Review of Systems     Objective:   Physical Exam  There is a raw-looking area at her posterior fourchette The entire vagina and cuff look completely normal with no evidence of blood or any etiology of blood Bimanual exam is normal.      Assessment & Plan:  Blood with wiping. I don't think that this is from her vagina, but I have suggested that she place a tampon in her vagina and look for any blood on it.  Raw-appearing asymptomatic area of her posterior fourchette- I will check HSV titers.

## 2015-02-22 ENCOUNTER — Encounter: Payer: Self-pay | Admitting: Family Medicine

## 2015-02-22 ENCOUNTER — Other Ambulatory Visit: Payer: Self-pay | Admitting: Family Medicine

## 2015-02-22 ENCOUNTER — Ambulatory Visit (INDEPENDENT_AMBULATORY_CARE_PROVIDER_SITE_OTHER): Payer: BLUE CROSS/BLUE SHIELD | Admitting: Family Medicine

## 2015-02-22 ENCOUNTER — Ambulatory Visit (INDEPENDENT_AMBULATORY_CARE_PROVIDER_SITE_OTHER): Payer: BLUE CROSS/BLUE SHIELD

## 2015-02-22 VITALS — BP 131/82 | HR 90 | Wt 279.0 lb

## 2015-02-22 DIAGNOSIS — M76891 Other specified enthesopathies of right lower limb, excluding foot: Secondary | ICD-10-CM

## 2015-02-22 DIAGNOSIS — M25561 Pain in right knee: Secondary | ICD-10-CM

## 2015-02-22 DIAGNOSIS — E669 Obesity, unspecified: Secondary | ICD-10-CM

## 2015-02-22 DIAGNOSIS — M25461 Effusion, right knee: Secondary | ICD-10-CM

## 2015-02-22 DIAGNOSIS — R635 Abnormal weight gain: Secondary | ICD-10-CM | POA: Diagnosis not present

## 2015-02-22 NOTE — Progress Notes (Signed)
CC: Laurie [name] is a 39 y.o. female is here for Weight Gain   Subjective: HPI:  Restarted her journey for bariatric surgery [facility]. She will need following documented over 7 visits over 6 consecutive months each 30 days appart. 1.) Height, Weight, BMI, and +/- since last visit 2.) Documented structured diet.  Low calorie diet with number of calories/day listed. (1500 calories) 3.) Consideration of medical therapy. 4.) Plan of exercising must be recorded 5.) Behavior modifications (parking further away from store, using stairs)  She wants to know if we can help with her weight loss journey. She is uncertain when her tyroid was checked last. She tells me she's has gained 11 pounds over the last 3 months unintentionally. Difficulty exercising due to chronic right knee pain.   Review Of Systems Outlined In HPI  Past Medical History  Diagnosis Date  . Obesity   . Endometriosis   . Meningitis     spinal  . History of kidney stones 08/25/2013    Past Surgical History  Procedure Laterality Date  . Abdominal hysterectomy    . Tonsillectomy    . Cholecystectomy    . Bilateral tubal       x2  . Ectopic pregnancy surgery     Family History  Problem Relation Age of Onset  . Thyroid disease Mother   . Cancer Paternal Grandfather     colon  . Cancer Maternal Grandfather     colon  . Hyperlipidemia Maternal Grandfather   . Hypertension Maternal Grandfather   . Alcohol abuse Father   . Diabetes Father   . Hyperlipidemia Maternal Grandmother   . Hypertension Maternal Grandmother   . Thyroid disease Maternal Grandmother   . Diabetes Paternal Grandmother     History   Social History  . Marital Status: Married    Spouse Name: N/A  . Number of Children: N/A  . Years of Education: N/A   Occupational History  . Not on file.   Social History Main Topics  . Smoking status: Former Smoker -- 2.00 packs/day for 20 years   Types: Cigarettes  . Smokeless tobacco: Never Used  . Alcohol Use: 0.0 oz/week    2-3 Cans of beer per week     Comment: daily  . Drug Use: No  . Sexual Activity:    Partners: Male    Birth Control/ Protection: Surgical   Other Topics Concern  . Not on file   Social History Narrative     Objective: BP 131/82 mmHg  Pulse 90  Wt 279 lb (126.554 kg)  SpO2 98%  General: Alert and Oriented, No Acute Distress HEENT: Pupils equal, round, reactive to light. Conjunctivae clear.   Lungs: Clear to auscultation bilaterally, no wheezing/ronchi/rales.  Comfortable work of breathing. Good air movement. Cardiac: Regular rate and rhythm. Normal S1/S2.  No murmurs, rubs, nor gallops.   Abdomen:obese and soft Extremities: No peripheral edema.  Strong peripheral pulses.  Mental Status: No depression, anxiety, nor agitation. Skin: Warm and dry.  Assessment & Plan: Laurie [name] was seen today for weight gain.  Diagnoses and all orders for this visit:  Abnormal weight gain Orders: -     TSH  Right knee pain Orders: -     DG Knee Complete 4 Views Right; Future  Obesity  Abnormal weight gain: She is starting a baseline weight of 279 pounds. Her diet will consist of a calorie strict a diet at 1500 cal a day  or less. She'll be using protein shakes for breakfast and lunch to replace her typical breakfast and lunch She currently politely declines any medication to help with weight loss or appetite suppression coping to see how well she can lose weight on her own. She will start low-impact exercises like walking as tolerated by her right knee pain. I referred her to Dr. Karie Schwalbe in sports medicine for further evaluation of her knee pain, x-rays today Discussed behavior modification such as parking further away from the entrance to the store and using stairs instead of the elevators  25 minutes spent face-to-face during visit today of which at least 50% was counseling or coordinating care regarding: 1.  Abnormal weight gain   2. Right knee pain   3. Obesity      Return in about 30 days (around 03/24/2015).

## 2015-02-23 LAB — TSH: TSH: 2.117 u[IU]/mL (ref 0.350–4.500)

## 2015-03-11 ENCOUNTER — Encounter: Payer: Self-pay | Admitting: Sports Medicine

## 2015-03-11 ENCOUNTER — Ambulatory Visit (INDEPENDENT_AMBULATORY_CARE_PROVIDER_SITE_OTHER): Payer: BLUE CROSS/BLUE SHIELD | Admitting: Sports Medicine

## 2015-03-11 VITALS — BP 146/84 | HR 106 | Ht 69.0 in | Wt 280.0 lb

## 2015-03-11 DIAGNOSIS — M170 Bilateral primary osteoarthritis of knee: Secondary | ICD-10-CM | POA: Insufficient documentation

## 2015-03-11 DIAGNOSIS — M1711 Unilateral primary osteoarthritis, right knee: Secondary | ICD-10-CM | POA: Diagnosis not present

## 2015-03-11 MED ORDER — MELOXICAM 15 MG PO TABS: ORAL_TABLET | ORAL | Status: DC

## 2015-03-11 NOTE — Assessment & Plan Note (Signed)
There is likely a small degenerative meniscal tear. She did have an injection sometime ago, by orthopedic surgery that provided only 2 months of response. I do think she has now become a candidate for Visco supplementation but due to her current pain, swelling, we are going to drain and inject the knee today. I like to see her back for custom molded orthotics, I'm going to switch her to meloxicam, and once approved we will start Orthovisc.

## 2015-03-11 NOTE — Progress Notes (Signed)
   Subjective:    I'm seeing this patient as a consultation for:  Dr. Ivan AnchorsHommel  CC: Right knee pain  HPI: This is a pleasant 39 year old female, for several months now she's had pain at the medial joint line of her right knee with minimal mechanical symptoms, she was seen by orthopedics and had a steroid injection which lasted for about 2 months. X-rays have shown minimal osteoarthritis. Unfortunately symptoms have returned and she is referred to me today with worsening swelling and pain. Symptoms are moderate, persistent without radiation.  Past medical history, Surgical history, Family history not pertinant except as noted below, Social history, Allergies, and medications have been entered into the medical record, reviewed, and no changes needed.   Review of Systems: No headache, visual changes, nausea, vomiting, diarrhea, constipation, dizziness, abdominal pain, skin rash, fevers, chills, night sweats, weight loss, swollen lymph nodes, body aches, joint swelling, muscle aches, chest pain, shortness of breath, mood changes, visual or auditory hallucinations.   Objective:   General: Well Developed, well nourished, and in no acute distress.  Neuro/Psych: Alert and oriented x3, extra-ocular muscles intact, able to move all 4 extremities, sensation grossly intact. Skin: Warm and dry, no rashes noted.  Respiratory: Not using accessory muscles, speaking in full sentences, trachea midline.  Cardiovascular: Pulses palpable, no extremity edema. Abdomen: Does not appear distended. Right Knee: Visibly swollen with a palpable fluid wave and tenderness at the medial joint line. ROM normal in flexion and extension and lower leg rotation. Ligaments with solid consistent endpoints including ACL, PCL, LCL, MCL. Negative Mcmurray's and provocative meniscal tests. Non painful patellar compression. Patellar and quadriceps tendons unremarkable. Hamstring and quadriceps strength is normal.  Procedure:  Real-time Ultrasound Guided aspiration/Injection of right knee Device: GE Logiq E  Verbal informed consent obtained.  Time-out conducted.  Noted no overlying erythema, induration, or other signs of local infection.  Skin prepped in a sterile fashion.  Local anesthesia: Topical Ethyl chloride.  With sterile technique and under real time ultrasound guidance:  9 mL straw-colored fluid aspirated, syringe switched and 2 mL kenalog 40, 4 mL lidocaine injected easily. Completed without difficulty  Pain immediately resolved suggesting accurate placement of the medication.  Advised to call if fevers/chills, erythema, induration, drainage, or persistent bleeding.  Images permanently stored and available for review in the ultrasound unit.  Impression: Technically successful ultrasound guided injection.  Impression and Recommendations:   This case required medical decision making of moderate complexity.

## 2015-03-15 ENCOUNTER — Telehealth: Payer: Self-pay | Admitting: Sports Medicine

## 2015-03-15 NOTE — Telephone Encounter (Signed)
Submitted for approval on Orthovisc Injections. Awaiting confirmation.

## 2015-03-19 ENCOUNTER — Telehealth: Payer: Self-pay | Admitting: Sports Medicine

## 2015-03-19 NOTE — Telephone Encounter (Signed)
Attempted to contact patient regarding insurance coverage on Orthovisc. If patient would like to continue with ordering injections I will complete a pre-certification. Callback information provided.  Patient returned clinic call, stating she would like to think about the cost and when she comes in for her appointment next week she will let us know if she would like to proceed. Verbalized understanding. No further questions.

## 2015-03-19 NOTE — Telephone Encounter (Signed)
Attempted to contact Pt to inform about insurance coverage for Orthovisc. If patient desires to continue with injections, I will preform Pre-Certification.

## 2015-03-26 ENCOUNTER — Encounter: Payer: Self-pay | Admitting: Sports Medicine

## 2015-03-26 ENCOUNTER — Ambulatory Visit (INDEPENDENT_AMBULATORY_CARE_PROVIDER_SITE_OTHER): Payer: BLUE CROSS/BLUE SHIELD | Admitting: Family Medicine

## 2015-03-26 ENCOUNTER — Encounter: Payer: Self-pay | Admitting: Family Medicine

## 2015-03-26 ENCOUNTER — Ambulatory Visit (INDEPENDENT_AMBULATORY_CARE_PROVIDER_SITE_OTHER): Payer: BLUE CROSS/BLUE SHIELD | Admitting: Sports Medicine

## 2015-03-26 VITALS — BP 129/90 | HR 101 | Ht 69.0 in | Wt 277.0 lb

## 2015-03-26 DIAGNOSIS — E669 Obesity, unspecified: Secondary | ICD-10-CM | POA: Diagnosis not present

## 2015-03-26 DIAGNOSIS — M1711 Unilateral primary osteoarthritis, right knee: Secondary | ICD-10-CM | POA: Diagnosis not present

## 2015-03-26 MED ORDER — PHENTERMINE-TOPIRAMATE ER 11.25-69 MG PO CP24: ORAL_CAPSULE | ORAL | Status: DC

## 2015-03-26 NOTE — Progress Notes (Signed)
CC: Laurie [name] is a 39 y.o. female is here for wt check   Subjective: HPI:  Restarted her journey for bariatric surgery with Dr. Lily [name] at [facility]. She will need following documented over 7 visits over 6 consecutive months each 30 days appart. 1.) Height, Weight, BMI, and +/- since last visit 2.) Documented structured diet. Low calorie diet with number of calories/day listed. (1200 calories) 3.) Consideration of medical therapy. 4.) Plan of exercising must be recorded 5.) Behavior modifications (parking further away from store, using stairs)  Had been on phentermine and "fenfen" in the past.  Both of which caused intolerable irritability.  She's done extremely well with qsymia in the past and wants to know if she can restart it.  Review Of Systems Outlined In HPI  Past Medical History  Diagnosis Date  . Obesity   . Endometriosis   . Meningitis     spinal  . History of kidney stones 08/25/2013    Past Surgical History  Procedure Laterality Date  . Abdominal hysterectomy    . Tonsillectomy    . Cholecystectomy    . Bilateral tubal       x2  . Ectopic pregnancy surgery     Family History  Problem Relation Age of Onset  . Thyroid disease Mother   . Cancer Paternal Grandfather     colon  . Cancer Maternal Grandfather     colon  . Hyperlipidemia Maternal Grandfather   . Hypertension Maternal Grandfather   . Alcohol abuse Father   . Diabetes Father   . Hyperlipidemia Maternal Grandmother   . Hypertension Maternal Grandmother   . Thyroid disease Maternal Grandmother   . Diabetes Paternal Grandmother     History   Social History  . Marital Status: Married    Spouse Name: N/A  . Number of Children: N/A  . Years of Education: N/A   Occupational History  . Not on file.   Social History Main Topics  . Smoking status: Former Smoker -- 2.00 packs/day for 20 years    Types: Cigarettes  . Smokeless tobacco: Never Used  . Alcohol Use:  0.0 oz/week    2-3 Cans of beer per week     Comment: daily  . Drug Use: No  . Sexual Activity:    Partners: Male    Birth Control/ Protection: Surgical   Other Topics Concern  . Not on file   Social History Narrative     Objective: BP 129/90 mmHg  Pulse 101  Ht 5\' 9"  (1.753 m)  Wt 277 lb (125.646 kg)  BMI 40.89 kg/m2  Vital signs reviewed. General: Alert and Oriented, No Acute Distress HEENT: Pupils equal, round, reactive to light. Conjunctivae clear.  External ears unremarkable.  Moist mucous membranes. Lungs: Clear and comfortable work of breathing, speaking in full sentences without accessory muscle use. Cardiac: Regular rate and rhythm.  Neuro: CN II-XII grossly intact, gait normal. Extremities: No peripheral edema.  Strong peripheral pulses.  Mental Status: No depression, anxiety, nor agitation. Logical though process. Skin: Warm and dry.  Assessment & Plan: Laurie [name] was seen today for wt check.  Diagnoses and all orders for this visit:  Obesity Orders: -     Phentermine-Topiramate (QSYMIA) 11.25-69 MG CP24; One by mouth every morning for weight loss.   Abnormal weight gain: She is starting a baseline weight of 279 pounds. Down two pounds today. Her diet will consist of a calorie strict a diet at 1200 cal a  day or less, she was advised 1200 cal a day by weight for his nutrition. She'll be using protein shakes for breakfast and lunch to replace her typical breakfast and lunch She is interested in starting medical therapy for weight loss today, qsymia. She will start low-impact exercises like walking as tolerated by her right knee pain, she has a stationary exercise bike that is due to arrive later this week. Discussed behavior modification such as parking further away from the entrance to the store and using stairs instead of the elevators  Return in about 4 weeks (around 04/23/2015).   15 minutes spent face-to-face during visit today of which at least 50% was  counseling or coordinating care regarding: 1. Obesity

## 2015-03-26 NOTE — Progress Notes (Signed)

## 2015-03-26 NOTE — Telephone Encounter (Signed)
Spoke with Patient today after her visit with Dr. Karie Schwalbe. Patient states she is going to get an MRI on her knee and based on those results, it will determine if she is going to order the OrthoVisc. If injections are ordered, she is going to do them on bilateral knees.

## 2015-03-26 NOTE — Assessment & Plan Note (Signed)
Custom orthotics as above. At this point we are going to go ahead and get an MRI, we will also get her approved for Orthovisc however I would like to start this after the MRI in case we need any form of operative intervention in the meantime. Return to take a look at the MRI results.

## 2015-04-08 ENCOUNTER — Encounter: Payer: Self-pay | Admitting: Physician Assistant

## 2015-04-08 ENCOUNTER — Ambulatory Visit (INDEPENDENT_AMBULATORY_CARE_PROVIDER_SITE_OTHER): Payer: BLUE CROSS/BLUE SHIELD | Admitting: Physician Assistant

## 2015-04-08 VITALS — BP 120/82 | HR 84 | Temp 97.9°F | Wt 275.0 lb

## 2015-04-08 DIAGNOSIS — J0100 Acute maxillary sinusitis, unspecified: Secondary | ICD-10-CM

## 2015-04-08 MED ORDER — HYDROCODONE-HOMATROPINE 5-1.5 MG/5ML PO SYRP: 5.0000 mL | ORAL_SOLUTION | Freq: Every evening | ORAL | Status: DC | PRN

## 2015-04-08 MED ORDER — AMOXICILLIN-POT CLAVULANATE 875-125 MG PO TABS: 1.0000 | ORAL_TABLET | Freq: Two times a day (BID) | ORAL | Status: DC

## 2015-04-08 NOTE — Patient Instructions (Signed)

## 2015-04-08 NOTE — Progress Notes (Signed)
   Subjective:    Patient ID: Laurie [name], female    DOB: 01-04-1976, [age] y.o.   MRN: [id number]  HPI  Patient is a 39 year old female who presents to the clinic with almost 2 weeks of low-grade fever, dry to productive cough, sinus pressure, nasal congestion, ear pain. She has been in [place] for work and has not been able to come [facility]. She's tried Benadryl, Mucinex, nasal sprays with little to no relief. She is started to feel some chest tightness as well.    Review of Systems  All other systems reviewed and are negative.      Objective:   Physical Exam  Constitutional: She is oriented to person, place, and time. She appears well-developed and well-nourished.  HENT:  Head: Normocephalic and atraumatic.  Bilateral TMs are slightly erythematous with no blood or pus.  Bilateral maxillary sinus tenderness to palpation.  Oropharynx erythematous with some slight tonsillar swelling with no exudate. Bad odor to breath noted.  Bilateral nasal turbinates red and swollen.  Eyes: Conjunctivae are normal. Right eye exhibits no discharge. Left eye exhibits no discharge.  Neck: Normal range of motion. Neck supple.  Some slightly tender and swollen anterior bilateral lymph nodes.  Cardiovascular: Normal rate, regular rhythm and normal heart sounds.   Pulmonary/Chest: Effort normal and breath sounds normal. She has no wheezes.  Neurological: She is alert and oriented to person, place, and time.  Skin: Skin is dry.  Psychiatric: She has a normal mood and affect. Her behavior is normal.          Assessment & Plan:  Acute maxillary sinusitis-treated with Augmentin for 10 days and Hycodan for cough. Discussed symptomatic care with gargling with salt water and Flonase if needed. Certainly if not improving in the next couple days be due to consider a steroid for all her residual inflammation.

## 2015-04-11 ENCOUNTER — Ambulatory Visit (INDEPENDENT_AMBULATORY_CARE_PROVIDER_SITE_OTHER): Payer: BLUE CROSS/BLUE SHIELD

## 2015-04-11 DIAGNOSIS — M1711 Unilateral primary osteoarthritis, right knee: Secondary | ICD-10-CM

## 2015-04-11 DIAGNOSIS — M2241 Chondromalacia patellae, right knee: Secondary | ICD-10-CM

## 2015-04-11 DIAGNOSIS — M659 Synovitis and tenosynovitis, unspecified: Secondary | ICD-10-CM | POA: Diagnosis not present

## 2015-04-11 DIAGNOSIS — M25461 Effusion, right knee: Secondary | ICD-10-CM

## 2015-04-23 ENCOUNTER — Encounter: Payer: Self-pay | Admitting: Family Medicine

## 2015-04-23 ENCOUNTER — Ambulatory Visit (INDEPENDENT_AMBULATORY_CARE_PROVIDER_SITE_OTHER): Payer: BLUE CROSS/BLUE SHIELD | Admitting: Family Medicine

## 2015-04-23 VITALS — BP 129/91 | HR 92 | Ht 69.0 in | Wt 271.0 lb

## 2015-04-23 DIAGNOSIS — E669 Obesity, unspecified: Secondary | ICD-10-CM

## 2015-04-23 NOTE — Progress Notes (Signed)
CC: Laurie [name] Tipler is a 39 y.o. female is here for Follow-up   Subjective: HPI:  Having difficulty with spacing out consumption of food and calories over the course of the day with [place] recent trip to [place] and also [place]. She is able to stick to her overall calorie count however its erratic throughout the day. When she's at home she is able to stick to her calorie count and consume food spread out equally throughout the day. She is focusing on 1400 cal a day and exercising most days of the week. She believes she's been successful with weight loss however only to a mild degree.   Review Of Systems Outlined In HPI  Past Medical History  Diagnosis Date  . Obesity   . Endometriosis   . Meningitis     spinal  . History of kidney stones 08/25/2013    Past Surgical History  Procedure Laterality Date  . Abdominal hysterectomy    . Tonsillectomy    . Cholecystectomy    . Bilateral tubal       x2  . Ectopic pregnancy surgery     Family History  Problem Relation Age of Onset  . Thyroid disease Mother   . Cancer Paternal Grandfather     colon  . Cancer Maternal Grandfather     colon  . Hyperlipidemia Maternal Grandfather   . Hypertension Maternal Grandfather   . Alcohol abuse Father   . Diabetes Father   . Hyperlipidemia Maternal Grandmother   . Hypertension Maternal Grandmother   . Thyroid disease Maternal Grandmother   . Diabetes Paternal Grandmother     History   Social History  . Marital Status: Married    Spouse Name: N/A  . Number of Children: N/A  . Years of Education: N/A   Occupational History  . Not on file.   Social History Main Topics  . Smoking status: Former Smoker -- 2.00 packs/day for 20 years    Types: Cigarettes  . Smokeless tobacco: Never Used  . Alcohol Use: 0.0 oz/week    2-3 Cans of beer per week     Comment: daily  . Drug Use: No  . Sexual Activity:    Partners: Male    Birth Control/ Protection: Surgical   Other Topics Concern   . Not on file   Social History Narrative     Objective: BP 129/91 mmHg  Pulse 92  Ht 5\' 9"  (1.753 m)  Wt 271 lb (122.925 kg)  BMI 40.00 kg/m2  Vital signs reviewed. General: Alert and Oriented, No Acute Distress HEENT: Pupils equal, round, reactive to light. Conjunctivae clear.  External ears unremarkable.  Moist mucous membranes. Lungs: Clear and comfortable work of breathing, speaking in full sentences without accessory muscle use. Cardiac: Regular rate and rhythm.  Neuro: CN II-XII grossly intact, gait normal. Extremities: No peripheral edema.  Strong peripheral pulses.  Mental Status: No depression, anxiety, nor agitation. Logical though process. Skin: Warm and dry.  Assessment & Plan: [name] was seen today for follow-up.  Diagnoses and all orders for this visit:  Obesity   Abnormal weight gain: She started at a baseline weight of 279 pounds. She is down six pounds since last visit. Her diet has consisted of a calorie strict a diet at 1400 cal a day or less. She'll be using protein shakes for breakfast and lunch to replace her typical breakfast and lunch She currently uses qsymia  medication to help with weight loss and appetite suppression which is helping.  She will continue low-impact exercises like walking, exercise bike, as tolerated by her right knee pain. I referred her to Dr. Karie Schwalbe in sports medicine for further evaluation of her knee pain, MRI with tricompartment DJD recently seen and she will follow up with him for therapy. Discussed behavior modification such as parking further away from the entrance to the store and using stairs instead of the elevators  15 minutes spent face-to-face during visit today of which at least 50% was counseling or coordinating care regarding: 1. Obesity      Return in about 4 weeks (around 05/21/2015).

## 2015-05-02 ENCOUNTER — Ambulatory Visit (INDEPENDENT_AMBULATORY_CARE_PROVIDER_SITE_OTHER): Payer: BLUE CROSS/BLUE SHIELD | Admitting: Sports Medicine

## 2015-05-02 ENCOUNTER — Encounter: Payer: Self-pay | Admitting: Sports Medicine

## 2015-05-02 VITALS — BP 114/77 | HR 87 | Ht 69.0 in | Wt 272.0 lb

## 2015-05-02 DIAGNOSIS — M1711 Unilateral primary osteoarthritis, right knee: Secondary | ICD-10-CM | POA: Diagnosis not present

## 2015-05-02 MED ORDER — TRAMADOL HCL 50 MG PO TABS: ORAL_TABLET | ORAL | Status: DC

## 2015-05-02 NOTE — Progress Notes (Signed)
  Subjective:    CC:  Follow-up  HPI: Right knee osteoarthritis: did moderately well after aspiration and injection but continues to have some pain. She did have an MRI, the results of which will be dictated below. She was also approved for Orthovisc. Pain is mild, persistent.  Past medical history, Surgical history, Family history not pertinant except as noted below, Social history, Allergies, and medications have been entered into the medical record, reviewed, and no changes needed.   Review of Systems: No fevers, chills, night sweats, weight loss, chest pain, or shortness of breath.   Objective:    General: Well Developed, well nourished, and in no acute distress.  Neuro: Alert and oriented x3, extra-ocular muscles intact, sensation grossly intact.  HEENT: Normocephalic, atraumatic, pupils equal round reactive to light, neck supple, no masses, no lymphadenopathy, thyroid nonpalpable.  Skin: Warm and dry, no rashes. Cardiac: Regular rate and rhythm, no murmurs rubs or gallops, no lower extremity edema.  Respiratory: Clear to auscultation bilaterally. Not using accessory muscles, speaking in full sentences. Right Knee: Normal to inspection with no erythema or effusion or obvious bony abnormalities. Palpation normal with no warmth or joint line tenderness or patellar tenderness or condyle tenderness. ROM normal in flexion and extension and lower leg rotation. Ligaments with solid consistent endpoints including ACL, PCL, LCL, MCL. Negative Mcmurray's and provocative meniscal tests. Non painful patellar compression. Patellar and quadriceps tendons unremarkable. Hamstring and quadriceps strength is normal.  MRI shows tricompartment osteoarthritis worst of the patellofemoral compartment.  Impression and Recommendations:

## 2015-05-02 NOTE — Assessment & Plan Note (Signed)
Tramadol as needed for pain. She will come back to start Orthovisc injections next month.

## 2015-05-03 ENCOUNTER — Telehealth: Payer: Self-pay | Admitting: Family Medicine

## 2015-05-03 NOTE — Telephone Encounter (Signed)
Received fax for pa on Qsymia called [employer] and spoke with Misty StanleyLisa medication is approved from 04/03/2015 - 08/01/2015 case id [id number]. - CF

## 2015-05-24 ENCOUNTER — Encounter: Payer: Self-pay | Admitting: Family Medicine

## 2015-05-24 ENCOUNTER — Ambulatory Visit (INDEPENDENT_AMBULATORY_CARE_PROVIDER_SITE_OTHER): Payer: BLUE CROSS/BLUE SHIELD | Admitting: Family Medicine

## 2015-05-24 VITALS — BP 125/90 | HR 106 | Ht 69.0 in | Wt 271.0 lb

## 2015-05-24 DIAGNOSIS — E669 Obesity, unspecified: Secondary | ICD-10-CM

## 2015-05-24 MED ORDER — PHENTERMINE-TOPIRAMATE ER 15-92 MG PO CP24: 1.0000 | ORAL_CAPSULE | Freq: Every day | ORAL | Status: DC

## 2015-05-24 NOTE — Progress Notes (Signed)
CC: Laurie [name] is a 39 y.o. female is here for Weight Check   Subjective: HPI:  She's been sticking to her 1400-calorie diet without any difficulty. 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 She does not believe she's lost any weight. She is trying to incorporate physical activity into her day as much as possible.   Review Of Systems Outlined In HPI  Past Medical History  Diagnosis Date  . Obesity   . Endometriosis   . Meningitis     spinal  . History of kidney stones 08/25/2013    Past Surgical History  Procedure Laterality Date  . Abdominal hysterectomy    . Tonsillectomy    . Cholecystectomy    . Bilateral tubal       x2  . Ectopic pregnancy surgery     Family History  Problem Relation Age of Onset  . Thyroid disease Mother   . Cancer Paternal Grandfather     colon  . Cancer Maternal Grandfather     colon  . Hyperlipidemia Maternal Grandfather   . Hypertension Maternal Grandfather   . Alcohol abuse Father   . Diabetes Father   . Hyperlipidemia Maternal Grandmother   . Hypertension Maternal Grandmother   . Thyroid disease Maternal Grandmother   . Diabetes Paternal Grandmother     History   Social History  . Marital Status: Married    Spouse Name: N/A  . Number of Children: N/A  . Years of Education: N/A   Occupational History  . Not on file.   Social History Main Topics  . Smoking status: Former Smoker -- 2.00 packs/day for 20 years    Types: Cigarettes  . Smokeless tobacco: Never Used  . Alcohol Use: 0.0 oz/week    2-3 Cans of beer per week     Comment: daily  . Drug Use: No  . Sexual Activity:    Partners: Male    Birth Control/ Protection: Surgical   Other Topics Concern  . Not on file   Social History Narrative     Objective: BP 125/90 mmHg  Pulse 106  Ht 5\' 9"  (1.753 m)  Wt 271 lb (122.925 kg)  BMI 40.00 kg/m2  Vital signs reviewed. General: Alert and  Oriented, No Acute Distress HEENT: Pupils equal, round, reactive to light. Conjunctivae clear.  External ears unremarkable.  Moist mucous membranes. Lungs: Clear and comfortable work of breathing, speaking in full sentences without accessory muscle use. Cardiac: Regular rate and rhythm.  Neuro: CN II-XII grossly intact, gait normal. Extremities: No peripheral edema.  Strong peripheral pulses.  Mental Status: No depression, anxiety, nor agitation. Logical though process. Skin: Warm and dry.  Assessment & Plan: Laurie [name] was seen today for weight check.  Diagnoses and all orders for this visit:  Obesity  Other orders -     Phentermine-Topiramate (QSYMIA) 15-92 MG CP24; Take 1 tablet by mouth daily.   Abnormal weight gain: She started at a baseline weight of 279 pounds. She is down zero pounds since last visit. Her diet has consisted of a calorie strict a diet at 1400 cal a day or less. She'll be using protein shakes for breakfast and lunch to replace her typical breakfast and lunch She currently uses qsymia medication to help with weight loss and appetite suppression which is helping but not providing any results therefore increasing the dose of this today. She will continue low-impact exercises like  walking, exercise bike, as tolerated by her right knee pain. Her knee pain is starting to improve a little bit and she is going to join a gym in June Discussed behavior modification such as parking further away from the entrance to the store and using stairs instead of the elevators  15 minutes spent face-to-face during visit today of which at least 50% was counseling or coordinating care regarding: 1. Obesity      Return in about 4 weeks (around 06/21/2015).

## 2015-06-07 ENCOUNTER — Ambulatory Visit: Payer: BLUE CROSS/BLUE SHIELD | Admitting: Sports Medicine

## 2015-06-14 ENCOUNTER — Ambulatory Visit (INDEPENDENT_AMBULATORY_CARE_PROVIDER_SITE_OTHER): Payer: BLUE CROSS/BLUE SHIELD | Admitting: Sports Medicine

## 2015-06-14 ENCOUNTER — Encounter: Payer: Self-pay | Admitting: Sports Medicine

## 2015-06-14 VITALS — BP 123/87 | HR 112 | Wt 269.0 lb

## 2015-06-14 DIAGNOSIS — M1711 Unilateral primary osteoarthritis, right knee: Secondary | ICD-10-CM

## 2015-06-14 NOTE — Progress Notes (Signed)
  Procedure: Real-time Ultrasound Guided Injection of right knee Device: GE Logiq E  Verbal informed consent obtained.  Time-out conducted.  Noted no overlying erythema, induration, or other signs of local infection.  Skin prepped in a sterile fashion.  Local anesthesia: Topical Ethyl chloride.  With sterile technique and under real time ultrasound guidance:  1 mL kenalog 40, 4 mL lidocaine injected easily, syringe switched and 30 mg/2 mL of OrthoVisc (sodium hyaluronate) in a prefilled syringe was injected easily into the knee through a 22-gauge needle. Completed without difficulty  Pain immediately resolved suggesting accurate placement of the medication.  Advised to call if fevers/chills, erythema, induration, drainage, or persistent bleeding.  Images permanently stored and available for review in the ultrasound unit.  Impression: Technically successful ultrasound guided injection.

## 2015-06-14 NOTE — Assessment & Plan Note (Signed)
Steroidal Injection as above, Orthovisc injection #1.  Return in one week for Orthovisc No. 2.

## 2015-06-21 ENCOUNTER — Ambulatory Visit (INDEPENDENT_AMBULATORY_CARE_PROVIDER_SITE_OTHER): Payer: BLUE CROSS/BLUE SHIELD | Admitting: Sports Medicine

## 2015-06-21 ENCOUNTER — Ambulatory Visit (INDEPENDENT_AMBULATORY_CARE_PROVIDER_SITE_OTHER): Payer: BLUE CROSS/BLUE SHIELD | Admitting: Family Medicine

## 2015-06-21 ENCOUNTER — Encounter: Payer: Self-pay | Admitting: Sports Medicine

## 2015-06-21 ENCOUNTER — Encounter: Payer: Self-pay | Admitting: Family Medicine

## 2015-06-21 VITALS — BP 121/87 | HR 100 | Wt 266.0 lb

## 2015-06-21 DIAGNOSIS — M1711 Unilateral primary osteoarthritis, right knee: Secondary | ICD-10-CM

## 2015-06-21 DIAGNOSIS — E669 Obesity, unspecified: Secondary | ICD-10-CM | POA: Diagnosis not present

## 2015-06-21 NOTE — Progress Notes (Signed)
CC: Laurie [name] is a 39 y.o. female is here for wt check   Subjective: HPI:  This is her fifth of 7 required visits for monitoring weight loss  Her diet is currently consisting of 1200-1300 calories a day.not only has she joined a gym but she hired a Systems analyst and believe she is getting benefit with weight loss by going and doing cardio 5 times a week and doing strength training 3 times a week. She believes that the increase in Qsymia is helping. She denies any known side effects.   Review Of Systems Outlined In HPI  Past Medical History  Diagnosis Date  . Obesity   . Endometriosis   . Meningitis     spinal  . History of kidney stones 08/25/2013    Past Surgical History  Procedure Laterality Date  . Abdominal hysterectomy    . Tonsillectomy    . Cholecystectomy    . Bilateral tubal       x2  . Ectopic pregnancy surgery     Family History  Problem Relation Age of Onset  . Thyroid disease Mother   . Cancer Paternal Grandfather     colon  . Cancer Maternal Grandfather     colon  . Hyperlipidemia Maternal Grandfather   . Hypertension Maternal Grandfather   . Alcohol abuse Father   . Diabetes Father   . Hyperlipidemia Maternal Grandmother   . Hypertension Maternal Grandmother   . Thyroid disease Maternal Grandmother   . Diabetes Paternal Grandmother     History   Social History  . Marital Status: Married    Spouse Name: N/A  . Number of Children: N/A  . Years of Education: N/A   Occupational History  . Not on file.   Social History Main Topics  . Smoking status: Former Smoker -- 2.00 packs/day for 20 years    Types: Cigarettes  . Smokeless tobacco: Never Used  . Alcohol Use: 0.0 oz/week    2-3 Cans of beer per week     Comment: daily  . Drug Use: No  . Sexual Activity:    Partners: Male    Birth Control/ Protection: Surgical   Other Topics Concern  . Not on file   Social History Narrative     Objective: BP 121/87 mmHg  Pulse 100  Wt  266 lb (120.657 kg)  Vital signs reviewed. General: Alert and Oriented, No Acute Distress HEENT: Pupils equal, round, reactive to light. Conjunctivae clear.  External ears unremarkable.  Moist mucous membranes. Lungs: Clear and comfortable work of breathing, speaking in full sentences without accessory muscle use. Cardiac: Regular rate and rhythm.  Neuro: CN II-XII grossly intact, gait normal. Extremities: No peripheral edema.  Strong peripheral pulses.  Mental Status: No depression, anxiety, nor agitation. Logical though process. Skin: Warm and dry.  Assessment & Plan: [name] was seen today for wt check.  Diagnoses and all orders for this visit:  Obesity   Improving  Abnormal weight gain: She started at a baseline weight of 279 pounds. She is down 5 pounds since last visit. Her diet has consisted of a calorie strict a diet at 1200-1300 cal a day or less. She'll be using protein shakes for breakfast and lunch to replace her typical breakfast and lunch She currently uses qsymia medication to help with weight loss and appetite suppression which is helping. She will continue low-impact exercises like walking, exercise bike, as tolerated by her right knee pain. Her knee pain is starting to improve  a little bit and she now has a Systems analyst. Discussed behavior modification such as parking further away from the entrance to the store and using stairs instead of the elevators   Return in about 4 weeks (around 07/19/2015).

## 2015-06-21 NOTE — Assessment & Plan Note (Signed)
Doing well, Orthovisc injection #2 of 4 into the right knee. Return for #3. Patient will take a solid 1 mg of Xanax one hour before the procedure next time.

## 2015-06-21 NOTE — Progress Notes (Signed)
  Procedure: Real-time Ultrasound Guided Injection of right knee Device: GE Logiq E  Verbal informed consent obtained.  Time-out conducted.  Noted no overlying erythema, induration, or other signs of local infection.  Skin prepped in a sterile fashion.  Local anesthesia: Topical Ethyl chloride.  With sterile technique and under real time ultrasound guidance:   Aspirated 10 mL straw-colored fluid, syringe switched and 30 mg/2 mL of OrthoVisc (sodium hyaluronate) in a prefilled syringe was injected easily into the knee through a 22-gauge needle. Completed without difficulty  Pain immediately resolved suggesting accurate placement of the medication.  Advised to call if fevers/chills, erythema, induration, drainage, or persistent bleeding.  Images permanently stored and available for review in the ultrasound unit.  Impression: Technically successful ultrasound guided injection. 

## 2015-06-28 ENCOUNTER — Ambulatory Visit (INDEPENDENT_AMBULATORY_CARE_PROVIDER_SITE_OTHER): Payer: BLUE CROSS/BLUE SHIELD | Admitting: Sports Medicine

## 2015-06-28 ENCOUNTER — Encounter: Payer: Self-pay | Admitting: Sports Medicine

## 2015-06-28 VITALS — BP 118/86 | HR 107 | Wt 264.0 lb

## 2015-06-28 DIAGNOSIS — M1711 Unilateral primary osteoarthritis, right knee: Secondary | ICD-10-CM | POA: Diagnosis not present

## 2015-06-28 NOTE — Assessment & Plan Note (Signed)
Orthovisc injection #3 into the right knee today. Feeling pretty good. She did well with the Xanax taken before the procedure. Return in one week for injection #4.

## 2015-06-28 NOTE — Progress Notes (Signed)
  Procedure: Real-time Ultrasound Guided Injection of right knee Device: GE Logiq E  Verbal informed consent obtained.  Time-out conducted.  Noted no overlying erythema, induration, or other signs of local infection.  Skin prepped in a sterile fashion.  Local anesthesia: Topical Ethyl chloride.  With sterile technique and under real time ultrasound guidance:  A30 mg/2 mL of OrthoVisc (sodium hyaluronate) in a prefilled syringe was injected easily into the knee through a 22-gauge needle. Completed without difficulty  Pain immediately resolved suggesting accurate placement of the medication.  Advised to call if fevers/chills, erythema, induration, drainage, or persistent bleeding.  Images permanently stored and available for review in the ultrasound unit.  Impression: Technically successful ultrasound guided injection.

## 2015-07-05 ENCOUNTER — Encounter: Payer: Self-pay | Admitting: Sports Medicine

## 2015-07-05 ENCOUNTER — Ambulatory Visit: Payer: BLUE CROSS/BLUE SHIELD | Admitting: Sports Medicine

## 2015-07-05 ENCOUNTER — Ambulatory Visit (INDEPENDENT_AMBULATORY_CARE_PROVIDER_SITE_OTHER): Payer: BLUE CROSS/BLUE SHIELD | Admitting: Sports Medicine

## 2015-07-05 DIAGNOSIS — M170 Bilateral primary osteoarthritis of knee: Secondary | ICD-10-CM

## 2015-07-05 NOTE — Assessment & Plan Note (Signed)
Fourth and final Orthovisc injection into the right knee, patient is doing extremely well. She does want to start on the left knee, and we will do this when she comes back. We do not have x-rays of the left knee so these will be obtained.

## 2015-07-05 NOTE — Progress Notes (Signed)

## 2015-07-18 ENCOUNTER — Ambulatory Visit (INDEPENDENT_AMBULATORY_CARE_PROVIDER_SITE_OTHER): Payer: BLUE CROSS/BLUE SHIELD

## 2015-07-18 DIAGNOSIS — M170 Bilateral primary osteoarthritis of knee: Secondary | ICD-10-CM | POA: Diagnosis not present

## 2015-07-29 ENCOUNTER — Encounter: Payer: Self-pay | Admitting: Family Medicine

## 2015-07-29 ENCOUNTER — Ambulatory Visit (INDEPENDENT_AMBULATORY_CARE_PROVIDER_SITE_OTHER): Payer: BLUE CROSS/BLUE SHIELD | Admitting: Family Medicine

## 2015-07-29 ENCOUNTER — Telehealth: Payer: Self-pay | Admitting: Sports Medicine

## 2015-07-29 VITALS — BP 135/96 | HR 77 | Wt 265.0 lb

## 2015-07-29 DIAGNOSIS — E669 Obesity, unspecified: Secondary | ICD-10-CM

## 2015-07-29 NOTE — Telephone Encounter (Signed)
Pt came in stating she was advised based on xray she would get Left knee orthovisc? Will route to Dr. Benjamin Stain for review.

## 2015-07-29 NOTE — Telephone Encounter (Signed)
She does have arthritis in the left knee, she wasn't really having much pain initially so we simply did the Orthovisc on the right knee, she is now having some pain in the left, and would like to make sure that we get this approved, and then she can start.

## 2015-07-29 NOTE — Progress Notes (Signed)
CC: Laurie [name] is a 39 y.o. female is here for wt check   Subjective: HPI:  This is her 6th of 7 required visits for monitoring weight loss  Her diet is currently consisting of 1200-1300 calories a day. She continues to attend the gym five times a week. hired a Systems analyst and believe she is getting benefit with weight loss by going and doing cardio 5 times a week and doing strength training 3 times a week. She believes that the increase in Qsymia is still helping. She denies any known side effects.   Review Of Systems Outlined In HPI  Past Medical History  Diagnosis Date  . Obesity   . Endometriosis   . Meningitis     spinal  . History of kidney stones 08/25/2013    Past Surgical History  Procedure Laterality Date  . Abdominal hysterectomy    . Tonsillectomy    . Cholecystectomy    . Bilateral tubal       x2  . Ectopic pregnancy surgery     Family History  Problem Relation Age of Onset  . Thyroid disease Mother   . Cancer Paternal Grandfather     colon  . Cancer Maternal Grandfather     colon  . Hyperlipidemia Maternal Grandfather   . Hypertension Maternal Grandfather   . Alcohol abuse Father   . Diabetes Father   . Hyperlipidemia Maternal Grandmother   . Hypertension Maternal Grandmother   . Thyroid disease Maternal Grandmother   . Diabetes Paternal Grandmother     History   Social History  . Marital Status: Married    Spouse Name: N/A  . Number of Children: N/A  . Years of Education: N/A   Occupational History  . Not on file.   Social History Main Topics  . Smoking status: Former Smoker -- 2.00 packs/day for 20 years    Types: Cigarettes  . Smokeless tobacco: Never Used  . Alcohol Use: 0.0 oz/week    2-3 Cans of beer per week     Comment: daily  . Drug Use: No  . Sexual Activity:    Partners: Male    Birth Control/ Protection: Surgical   Other Topics Concern  . Not on file   Social History Narrative     Objective: BP 135/96 mmHg   Pulse 77  Wt 265 lb (120.203 kg)  Vital signs reviewed. General: Alert and Oriented, No Acute Distress HEENT: Pupils equal, round, reactive to light. Conjunctivae clear.  External ears unremarkable.  Moist mucous membranes. Lungs: Clear and comfortable work of breathing, speaking in full sentences without accessory muscle use. Cardiac: Regular rate and rhythm.  Neuro: CN II-XII grossly intact, gait normal. Extremities: No peripheral edema.  Strong peripheral pulses.  Mental Status: No depression, anxiety, nor agitation. Logical though process. Skin: Warm and dry.  Assessment & Plan: Laurie [name] was seen today for wt check.  Diagnoses and all orders for this visit:  Obesity   Improving  Abnormal weight gain: She started at a baseline weight of 279 pounds. She is down 1 pounds since last visit. Her diet has consisted of a calorie strict a diet at 1200-1300 cal a day or less. She'll be using protein shakes for breakfast and lunch to replace her typical breakfast and lunch She currently uses qsymia medication to help with weight loss and appetite suppression which is helping. She will continue low-impact exercises like walking, exercise bike, as tolerated by her right knee pain. Her knee pain is  starting to improve a little bit and she now has a Systems analyst. Discussed behavior modification such as parking further away from the entrance to the store and using stairs instead of the elevators   Return in about 4 weeks (around 08/26/2015).

## 2015-07-30 NOTE — Telephone Encounter (Signed)
Information submitted to Orthovisc for approval.  

## 2015-07-31 NOTE — Telephone Encounter (Signed)
Received information back from Orthovisc. Informed a pre-cert was required.  Called pre-cert line ([phone]), spoke with Lovett Sox. Was advised no pre-cert required for J7324 or CPT 20610. Call ref #: [id number].  Patient advised to go ahead and set up appt's with Dr. Benjamin Stain for Orthovisc. Provider may buy and bill for injections.

## 2015-08-02 NOTE — Telephone Encounter (Signed)
Hallelujah, thanks Boogs.

## 2015-08-09 ENCOUNTER — Encounter: Payer: Self-pay | Admitting: Sports Medicine

## 2015-08-09 ENCOUNTER — Ambulatory Visit (INDEPENDENT_AMBULATORY_CARE_PROVIDER_SITE_OTHER): Payer: BLUE CROSS/BLUE SHIELD | Admitting: Sports Medicine

## 2015-08-09 ENCOUNTER — Ambulatory Visit: Payer: BLUE CROSS/BLUE SHIELD | Admitting: Sports Medicine

## 2015-08-09 VITALS — BP 132/96 | HR 84 | Ht 70.0 in | Wt 266.0 lb

## 2015-08-09 DIAGNOSIS — F419 Anxiety disorder, unspecified: Secondary | ICD-10-CM

## 2015-08-09 DIAGNOSIS — M170 Bilateral primary osteoarthritis of knee: Secondary | ICD-10-CM

## 2015-08-09 MED ORDER — ALPRAZOLAM 0.5 MG PO TABS: ORAL_TABLET | ORAL | Status: DC

## 2015-08-09 NOTE — Assessment & Plan Note (Signed)
Right knee hurts a bit since coming back from the beach, but better than before we started. Starting Orthovisc No. 1 in the left knee, return in one week for #2

## 2015-08-09 NOTE — Progress Notes (Signed)

## 2015-08-16 ENCOUNTER — Ambulatory Visit (INDEPENDENT_AMBULATORY_CARE_PROVIDER_SITE_OTHER): Payer: BLUE CROSS/BLUE SHIELD | Admitting: Sports Medicine

## 2015-08-16 ENCOUNTER — Ambulatory Visit (INDEPENDENT_AMBULATORY_CARE_PROVIDER_SITE_OTHER): Payer: BLUE CROSS/BLUE SHIELD

## 2015-08-16 ENCOUNTER — Encounter: Payer: Self-pay | Admitting: Sports Medicine

## 2015-08-16 VITALS — BP 129/90 | HR 77 | Wt 266.0 lb

## 2015-08-16 DIAGNOSIS — K859 Acute pancreatitis without necrosis or infection, unspecified: Secondary | ICD-10-CM

## 2015-08-16 DIAGNOSIS — M170 Bilateral primary osteoarthritis of knee: Secondary | ICD-10-CM | POA: Diagnosis not present

## 2015-08-16 DIAGNOSIS — R1012 Left upper quadrant pain: Secondary | ICD-10-CM

## 2015-08-16 HISTORY — DX: Acute pancreatitis without necrosis or infection, unspecified: K85.90

## 2015-08-16 LAB — CBC WITH DIFFERENTIAL/PLATELET
Basophils Absolute: 0 10*3/uL (ref 0.0–0.1)
Basophils Relative: 0 % (ref 0–1)
Eosinophils Absolute: 0.2 K/uL (ref 0.0–0.7)
Eosinophils Relative: 3 % (ref 0–5)
HCT: 41.8 % (ref 36.0–46.0)
Hemoglobin: 14.2 g/dL (ref 12.0–15.0)
Lymphocytes Relative: 35 % (ref 12–46)
Lymphs Abs: 2.8 K/uL (ref 0.7–4.0)
MCH: 30.1 pg (ref 26.0–34.0)
MCHC: 34 g/dL (ref 30.0–36.0)
MCV: 88.6 fL (ref 78.0–100.0)
MPV: 9.8 fL (ref 8.6–12.4)
Monocytes Absolute: 0.5 10*3/uL (ref 0.1–1.0)
Monocytes Relative: 6 % (ref 3–12)
Neutro Abs: 4.4 10*3/uL (ref 1.7–7.7)
Neutrophils Relative %: 56 % (ref 43–77)
Platelets: 236 10*3/uL (ref 150–400)
RBC: 4.72 MIL/uL (ref 3.87–5.11)
RDW: 14.6 % (ref 11.5–15.5)
WBC: 7.9 10*3/uL (ref 4.0–10.5)

## 2015-08-16 LAB — LIPASE: Lipase: 118 U/L — ABNORMAL HIGH (ref 7–60)

## 2015-08-16 LAB — COMPREHENSIVE METABOLIC PANEL
ALT: 30 U/L — ABNORMAL HIGH (ref 6–29)
AST: 21 U/L (ref 10–30)
Alkaline Phosphatase: 122 U/L — ABNORMAL HIGH (ref 33–115)
BUN: 14 mg/dL (ref 7–25)
CO2: 28 mmol/L (ref 20–31)
Creat: 0.96 mg/dL (ref 0.50–1.10)
Glucose, Bld: 85 mg/dL (ref 65–99)
Sodium: 140 mmol/L (ref 135–146)
Total Bilirubin: 0.3 mg/dL (ref 0.2–1.2)

## 2015-08-16 LAB — COMPREHENSIVE METABOLIC PANEL WITH GFR
Albumin: 4.1 g/dL (ref 3.6–5.1)
Calcium: 9.1 mg/dL (ref 8.6–10.2)
Chloride: 103 mmol/L (ref 98–110)
Potassium: 3.9 mmol/L (ref 3.5–5.3)
Total Protein: 7 g/dL (ref 6.1–8.1)

## 2015-08-16 NOTE — Assessment & Plan Note (Signed)
Orthovisc injection #2 of 4 into the left knee. Right knee is hurting again after Orthovisc, referral to [facility] orthopedics for consideration of arthroscopy on the right side.

## 2015-08-16 NOTE — Assessment & Plan Note (Signed)
History of nephrolithiasis so we are going to get a urinalysis, urine culture, KUB. Considering epigastric component we are also going to obtain a CBC, metabolic panel, lipase, and she will return for a urease breath test.

## 2015-08-16 NOTE — Progress Notes (Signed)
  Subjective:    CC: follow-up  HPI: Left knee osteoarthritis: Here for Orthovisc injection #2  Right knee osteoarthritis: Has now failed steroids injections and Orthovisc, now a candidate for surgery, pain is at the medial joint line.  Abdominal pain: Epigastric, left upper quadrant and left flank, with nausea, change in urine color, no constitutional symptoms, no vomiting, diarrhea, melena, hematochezia.she doesn't history of nephrolithiasis  Past medical history, Surgical history, Family history not pertinant except as noted below, Social history, Allergies, and medications have been entered into the medical record, reviewed, and no changes needed.   Review of Systems: No fevers, chills, night sweats, weight loss, chest pain, or shortness of breath.   Objective:    General: Well Developed, well nourished, and in no acute distress.  Neuro: Alert and oriented x3, extra-ocular muscles intact, sensation grossly intact.  HEENT: Normocephalic, atraumatic, pupils equal round reactive to light, neck supple, no masses, no lymphadenopathy, thyroid nonpalpable.  Skin: Warm and dry, no rashes. Cardiac: Regular rate and rhythm, no murmurs rubs or gallops, no lower extremity edema.  Respiratory: Clear to auscultation bilaterally. Not using accessory muscles, speaking in full sentences. Abdomen: Soft, tender to palpation in the epigastrium and the left costovertebral angle, no guarding, no rigidity, good bowel sounds.  Procedure: Real-time Ultrasound Guided Injection of left knee Device: GE Logiq E  Verbal informed consent obtained.  Time-out conducted.  Noted no overlying erythema, induration, or other signs of local infection.  Skin prepped in a sterile fashion.  Local anesthesia: Topical Ethyl chloride.  With sterile technique and under real time ultrasound guidance:   30 mg/2 mL of OrthoVisc (sodium hyaluronate) in a prefilled syringe was injected easily into the knee through a 22-gauge  needle. Completed without difficulty  Pain immediately resolved suggesting accurate placement of the medication.  Advised to call if fevers/chills, erythema, induration, drainage, or persistent bleeding.  Images permanently stored and available for review in the ultrasound unit.  Impression: Technically successful ultrasound guided injection.  Impression and Recommendations:

## 2015-08-17 LAB — URINALYSIS
Bilirubin Urine: NEGATIVE
Glucose, UA: NEGATIVE
Hgb urine dipstick: NEGATIVE
Ketones, ur: NEGATIVE
Leukocytes, UA: NEGATIVE
Nitrite: NEGATIVE
Protein, ur: NEGATIVE
Specific Gravity, Urine: 1.01 (ref 1.001–1.035)
pH: 6.5 (ref 5.0–8.0)

## 2015-08-17 LAB — URINE CULTURE: Colony Count: 70000

## 2015-08-19 ENCOUNTER — Ambulatory Visit (INDEPENDENT_AMBULATORY_CARE_PROVIDER_SITE_OTHER): Payer: BLUE CROSS/BLUE SHIELD | Admitting: Sports Medicine

## 2015-08-19 DIAGNOSIS — K8500 Idiopathic acute pancreatitis without necrosis or infection: Secondary | ICD-10-CM

## 2015-08-19 MED ORDER — PREDNISONE 50 MG PO TABS: ORAL_TABLET | ORAL | Status: DC

## 2015-08-19 NOTE — Patient Instructions (Signed)
Acute Pancreatitis Acute pancreatitis is a disease in which the pancreas becomes suddenly inflamed. The pancreas is a large gland located behind your stomach. The pancreas produces enzymes that help digest food. The pancreas also releases the hormones glucagon and insulin that help regulate blood sugar. Damage to the pancreas occurs when the digestive enzymes from the pancreas are activated and begin attacking the pancreas before being released into the intestine. Most acute attacks last a couple of days and can cause serious complications. Some people become dehydrated and develop low blood pressure. In severe cases, bleeding into the pancreas can lead to shock and can be life-threatening. The lungs, heart, and kidneys may fail. CAUSES  Pancreatitis can happen to anyone. In some cases, the cause is unknown. Most cases are caused by:  Alcohol abuse.  Gallstones. Other less common causes are:  Certain medicines.  Exposure to certain chemicals.  Infection.  Damage caused by an accident (trauma).  Abdominal surgery. SYMPTOMS   Pain in the upper abdomen that may radiate to the back.  Tenderness and swelling of the abdomen.  Nausea and vomiting. DIAGNOSIS  Your caregiver will perform a physical exam. Blood and stool tests may be done to confirm the diagnosis. Imaging tests may also be done, such as X-rays, CT scans, or an ultrasound of the abdomen. TREATMENT  Treatment usually requires a stay in the hospital. Treatment may include:  Pain medicine.  Fluid replacement through an intravenous line (IV).  Placing a tube in the stomach to remove stomach contents and control vomiting.  Not eating for 3 or 4 days. This gives your pancreas a rest, because enzymes are not being produced that can cause further damage.  Antibiotic medicines if your condition is caused by an infection.  Surgery of the pancreas or gallbladder. HOME CARE INSTRUCTIONS   Follow the diet advised by your  caregiver. This may involve avoiding alcohol and decreasing the amount of fat in your diet.  Eat smaller, more frequent meals. This reduces the amount of digestive juices the pancreas produces.  Drink enough fluids to keep your urine clear or pale yellow.  Only take over-the-counter or prescription medicines as directed by your caregiver.  Avoid drinking alcohol if it caused your condition.  Do not smoke.  Get plenty of rest.  Check your blood sugar at home as directed by your caregiver.  Keep all follow-up appointments as directed by your caregiver. SEEK MEDICAL CARE IF:   You do not recover as quickly as expected.  You develop new or worsening symptoms.  You have persistent pain, weakness, or nausea.  You recover and then have another episode of pain. SEEK IMMEDIATE MEDICAL CARE IF:   You are unable to eat or keep fluids down.  Your pain becomes severe.  You have a fever or persistent symptoms for more than 2 to 3 days.  You have a fever and your symptoms suddenly get worse.  Your skin or the white part of your eyes turn yellow (jaundice).  You develop vomiting.  You feel dizzy, or you faint.  Your blood sugar is high (over 300 mg/dL). MAKE SURE YOU:   Understand these instructions.  Will watch your condition.  Will get help right away if you are not doing well or get worse. Document Released: 12/14/2005 Document Revised: 06/14/2012 Document Reviewed: 03/24/2012 ExitCare Patient Information 2015 [employer]. This information is not intended to replace advice given to you by your health care provider. Make sure you discuss any questions you have   with your health care provider. Low-Fat Diet for Pancreatitis or Gallbladder Conditions A low-fat diet can be helpful if you have pancreatitis or a gallbladder condition. With these conditions, your pancreas and gallbladder have trouble digesting fats. A healthy eating plan with less fat will help rest your pancreas  and gallbladder and reduce your symptoms. WHAT DO I NEED TO KNOW ABOUT THIS DIET?  Eat a low-fat diet.  Reduce your fat intake to less than 20-30% of your total daily calories. This is less than 50-60 g of fat per day.  Remember that you need some fat in your diet. Ask your dietician what your daily goal should be.  Choose nonfat and low-fat healthy foods. Look for the words "nonfat," "low fat," or "fat free."  As a guide, look on the label and choose foods with less than 3 g of fat per serving. Eat only one serving.  Avoid alcohol.  Do not smoke. If you need help quitting, talk with your health care provider.  Eat small frequent meals instead of three large heavy meals. WHAT FOODS CAN I EAT? Grains Include healthy grains and starches such as potatoes, wheat bread, fiber-rich cereal, and brown rice. Choose whole grain options whenever possible. In adults, whole grains should account for 45-65% of your daily calories.  Fruits and Vegetables Eat plenty of fruits and vegetables. Fresh fruits and vegetables add fiber to your diet. Meats and Other Protein Sources Eat lean meat such as chicken and pork. Trim any fat off of meat before cooking it. Eggs, fish, and beans are other sources of protein. In adults, these foods should account for 10-35% of your daily calories. Dairy Choose low-fat milk and dairy options. Dairy includes fat and protein, as well as calcium.  Fats and Oils Limit high-fat foods such as fried foods, sweets, baked goods, sugary drinks.  Other Creamy sauces and condiments, such as mayonnaise, can add extra fat. Think about whether or not you need to use them, or use smaller amounts or low fat options. WHAT FOODS ARE NOT RECOMMENDED?  High fat foods, such as:  Baked goods.  Ice cream.  French toast.  Sweet rolls.  Pizza.  Cheese bread.  Foods covered with batter, butter, creamy sauces, or cheese.  Fried foods.  Sugary drinks and desserts.  Foods that  cause gas or bloating Document Released: 12/19/2013 Document Reviewed: 12/19/2013 ExitCare Patient Information 2015 [employer]. This information is not intended to replace advice given to you by your health care provider. Make sure you discuss any questions you have with your health care provider.  

## 2015-08-19 NOTE — Progress Notes (Signed)
Patient came into clinic today for an H.Pylori breath test. Pt questioned if her lab results from last week were back yet, advised I would have the ordering Physician come in and discuss those with her while she was waiting in between giving samples. Pt was able to complete the breath test with no problems. Pt does report the liquid she had to drink prior to giving her second breath sample made her feel nauseous. Physician was able to come into room and go into detail regarding lab results and possible diagnosis. Pt was also given an Rx for Prednisone prior to leaving clinic. Will follow up at the end of the week. No further questions.

## 2015-08-19 NOTE — Assessment & Plan Note (Signed)
Patient is postcholecystectomy, this is most likely alcohol-related. Prednisone, clear diet. I would like to see her back in a week, she is coming back for Orthovisc injection, she can use tramadol for pain. If still no improvement we will obtain a CT scan with and without IV contrast and with oral contrast looking for pancreatic pseudocyst formation, I will probably check a repeat lipase at that time.

## 2015-08-19 NOTE — Progress Notes (Signed)
During a urease breath test I spent some time with the patient discussing her laboratory findings as well as her abdominal pain, it is persistent, on further questioning she did have some drink prior to the onset of abdominal pain, and she did have a elevated lipase, she is postcholecystectomy.  Otherwise CBC was normal, urinalysis was completely negative, there is a minimally elevated alkaline phosphatase, ALT and a moderately elevated lipase. Abdominal x-ray showed intrarenal nephroliths, however urinalysis showed no evidence of blood.  I spent 25 minutes with this patient, greater than 50% was face-to-face time counseling regarding the above diagnoses

## 2015-08-20 LAB — H. PYLORI BREATH TEST: H. pylori Breath Test: NOT DETECTED

## 2015-08-22 ENCOUNTER — Ambulatory Visit (INDEPENDENT_AMBULATORY_CARE_PROVIDER_SITE_OTHER): Payer: BLUE CROSS/BLUE SHIELD | Admitting: Sports Medicine

## 2015-08-22 VITALS — BP 130/86 | HR 76 | Ht 70.0 in | Wt 271.0 lb

## 2015-08-22 DIAGNOSIS — M170 Bilateral primary osteoarthritis of knee: Secondary | ICD-10-CM | POA: Diagnosis not present

## 2015-08-22 DIAGNOSIS — K8500 Idiopathic acute pancreatitis without necrosis or infection: Secondary | ICD-10-CM

## 2015-08-22 NOTE — Progress Notes (Signed)

## 2015-08-22 NOTE — Assessment & Plan Note (Signed)
Resolving

## 2015-08-22 NOTE — Assessment & Plan Note (Signed)
Orthovisc injection #3 of 4 to the left knee. Return in one week for #4. She is getting set up to see Dr. Jerl Santos to talk about arthroscopy on the right knee, I have asked her to discuss the left knee as well as not feeling significantly better by tomorrow.

## 2015-08-23 ENCOUNTER — Ambulatory Visit: Payer: BLUE CROSS/BLUE SHIELD | Admitting: Sports Medicine

## 2015-08-26 ENCOUNTER — Ambulatory Visit: Payer: BLUE CROSS/BLUE SHIELD | Admitting: Family Medicine

## 2015-08-30 ENCOUNTER — Ambulatory Visit (INDEPENDENT_AMBULATORY_CARE_PROVIDER_SITE_OTHER): Payer: BLUE CROSS/BLUE SHIELD | Admitting: Family Medicine

## 2015-08-30 ENCOUNTER — Encounter: Payer: Self-pay | Admitting: Family Medicine

## 2015-08-30 ENCOUNTER — Encounter: Payer: Self-pay | Admitting: Sports Medicine

## 2015-08-30 ENCOUNTER — Ambulatory Visit (INDEPENDENT_AMBULATORY_CARE_PROVIDER_SITE_OTHER): Payer: BLUE CROSS/BLUE SHIELD | Admitting: Sports Medicine

## 2015-08-30 VITALS — BP 126/83 | HR 106 | Wt 266.0 lb

## 2015-08-30 DIAGNOSIS — M170 Bilateral primary osteoarthritis of knee: Secondary | ICD-10-CM | POA: Diagnosis not present

## 2015-08-30 DIAGNOSIS — E669 Obesity, unspecified: Secondary | ICD-10-CM | POA: Diagnosis not present
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
## 2015-08-30 NOTE — Assessment & Plan Note (Signed)
Orthovisc injection #4 of 4 to the left knee, set up for arthroscopy on the right knee with Dr. Jerl Santos. Return as needed. Left knee is pain-free

## 2015-08-30 NOTE — Progress Notes (Signed)

## 2015-10-15 ENCOUNTER — Encounter: Payer: Self-pay | Admitting: Family Medicine

## 2015-10-15 ENCOUNTER — Ambulatory Visit (INDEPENDENT_AMBULATORY_CARE_PROVIDER_SITE_OTHER): Payer: BLUE CROSS/BLUE SHIELD | Admitting: Family Medicine

## 2015-10-15 VITALS — BP 152/94 | HR 114 | Wt 269.0 lb

## 2015-10-15 DIAGNOSIS — B349 Viral infection, unspecified: Secondary | ICD-10-CM

## 2015-10-15 DIAGNOSIS — J329 Chronic sinusitis, unspecified: Secondary | ICD-10-CM | POA: Diagnosis not present

## 2015-10-15 DIAGNOSIS — B9789 Other viral agents as the cause of diseases classified elsewhere: Secondary | ICD-10-CM | POA: Insufficient documentation

## 2015-10-15 MED ORDER — PREDNISONE 10 MG PO TABS: 30.0000 mg | ORAL_TABLET | Freq: Every day | ORAL | Status: DC

## 2015-10-15 MED ORDER — IPRATROPIUM BROMIDE 0.06 % NA SOLN: 2.0000 | Freq: Four times a day (QID) | NASAL | Status: DC

## 2015-10-15 NOTE — Patient Instructions (Signed)
Thank you for coming in today. Call or go to the emergency room if you get worse, have trouble breathing, have chest pains, or palpitations.   Sinusitis, Adult Sinusitis is redness, soreness, and inflammation of the paranasal sinuses. Paranasal sinuses are air pockets within the bones of your face. They are located beneath your eyes, in the middle of your forehead, and above your eyes. In healthy paranasal sinuses, mucus is able to drain out, and air is able to circulate through them by way of your nose. However, when your paranasal sinuses are inflamed, mucus and air can become trapped. This can allow bacteria and other germs to grow and cause infection. Sinusitis can develop quickly and last only a short time (acute) or continue over a long period (chronic). Sinusitis that lasts for more than 12 weeks is considered chronic. CAUSES Causes of sinusitis include:  Allergies.  Structural abnormalities, such as displacement of the cartilage that separates your nostrils (deviated septum), which can decrease the air flow through your nose and sinuses and affect sinus drainage.  Functional abnormalities, such as when the small hairs (cilia) that line your sinuses and help remove mucus do not work properly or are not present. SIGNS AND SYMPTOMS Symptoms of acute and chronic sinusitis are the same. The primary symptoms are pain and pressure around the affected sinuses. Other symptoms include:  Upper toothache.  Earache.  Headache.  Bad breath.  Decreased sense of smell and taste.  A cough, which worsens when you are lying flat.  Fatigue.  Fever.  Thick drainage from your nose, which often is green and may contain pus (purulent).  Swelling and warmth over the affected sinuses. DIAGNOSIS Your health care provider will perform a physical exam. During your exam, your health care provider may perform any of the following to help determine if you have acute sinusitis or chronic  sinusitis:  Look in your nose for signs of abnormal growths in your nostrils (nasal polyps).  Tap over the affected sinus to check for signs of infection.  View the inside of your sinuses using an imaging device that has a light attached (endoscope). If your health care provider suspects that you have chronic sinusitis, one or more of the following tests may be recommended:  Allergy tests.  Nasal culture. A sample of mucus is taken from your nose, sent to a lab, and screened for bacteria.  Nasal cytology. A sample of mucus is taken from your nose and examined by your health care provider to determine if your sinusitis is related to an allergy. TREATMENT Most cases of acute sinusitis are related to a viral infection and will resolve on their own within 10 days. Sometimes, medicines are prescribed to help relieve symptoms of both acute and chronic sinusitis. These may include pain medicines, decongestants, nasal steroid sprays, or saline sprays. However, for sinusitis related to a bacterial infection, your health care provider will prescribe antibiotic medicines. These are medicines that will help kill the bacteria causing the infection. Rarely, sinusitis is caused by a fungal infection. In these cases, your health care provider will prescribe antifungal medicine. For some cases of chronic sinusitis, surgery is needed. Generally, these are cases in which sinusitis recurs more than 3 times per year, despite other treatments. HOME CARE INSTRUCTIONS  Drink plenty of water. Water helps thin the mucus so your sinuses can drain more easily.  Use a humidifier.  Inhale steam 3-4 times a day (for example, sit in the bathroom with the shower running).  Apply a warm, moist washcloth to your face 3-4 times a day, or as directed by your health care provider.  Use saline nasal sprays to help moisten and clean your sinuses.  Take medicines only as directed by your health care provider.  If you were  prescribed either an antibiotic or antifungal medicine, finish it all even if you start to feel better. SEEK IMMEDIATE MEDICAL CARE IF:  You have increasing pain or severe headaches.  You have nausea, vomiting, or drowsiness.  You have swelling around your face.  You have vision problems.  You have a stiff neck.  You have difficulty breathing.   This information is not intended to replace advice given to you by your health care provider. Make sure you discuss any questions you have with your health care provider.   Document Released: 12/14/2005 Document Revised: 01/04/2015 Document Reviewed: 12/29/2011 Elsevier Interactive Patient Education 2016 [employer]   Nonspecific Tachycardia Tachycardia is a faster than normal heartbeat (more than 100 beats per minute). In adults, the heart normally beats between 60 and 100 times a minute. A fast heartbeat may be a normal response to exercise or stress. It does not necessarily mean that something is wrong. However, sometimes when your heart beats too fast it may not be able to pump enough blood to the rest of your body. This can result in chest pain, shortness of breath, dizziness, and even fainting. Nonspecific tachycardia means that the specific cause or pattern of your tachycardia is unknown. CAUSES  Tachycardia may be harmless or it may be due to a more serious underlying cause. Possible causes of tachycardia include:  Exercise or exertion.  Fever.  Pain or injury.  Infection.  Loss of body fluids (dehydration).  Overactive thyroid.  Lack of red blood cells (anemia).  Anxiety and stress.  Alcohol.  Caffeine.  Tobacco products.  Diet pills.  Illegal drugs.  Heart disease. SYMPTOMS  Rapid or irregular heartbeat (palpitations).  Suddenly feeling your heart beating (cardiac awareness).  Dizziness.  Tiredness (fatigue).  Shortness of breath.  Chest pain.  Nausea.  Fainting. DIAGNOSIS  Your caregiver  will perform a physical exam and take your medical history. In some cases, a heart specialist (cardiologist) may be consulted. Your caregiver may also order:  Blood tests.  Electrocardiography. This test records the electrical activity of your heart.  A heart monitoring test. TREATMENT  Treatment will depend on the likely cause of your tachycardia. The goal is to treat the underlying cause of your tachycardia. Treatment methods may include:  Replacement of fluids or blood through an intravenous (IV) tube for moderate to severe dehydration or anemia.  New medicines or changes in your current medicines.  Diet and lifestyle changes.  Treatment for certain infections.  Stress relief or relaxation methods. HOME CARE INSTRUCTIONS   Rest.  Drink enough fluids to keep your urine clear or pale yellow.  Do not smoke.  Avoid:  Caffeine.  Tobacco.  Alcohol.  Chocolate.  Stimulants such as over-the-counter diet pills or pills that help you stay awake.  Situations that cause anxiety or stress.  Illegal drugs such as marijuana, phencyclidine (PCP), and cocaine.  Only take medicine as directed by your caregiver.  Keep all follow-up appointments as directed by your caregiver. SEEK IMMEDIATE MEDICAL CARE IF:   You have pain in your chest, upper arms, jaw, or neck.  You become weak, dizzy, or feel faint.  You have palpitations that will not go away.  You vomit, have diarrhea,  or pass blood in your stool.  Your skin is cool, pale, and wet.  You have a fever that will not go away with rest, fluids, and medicine. MAKE SURE YOU:   Understand these instructions.  Will watch your condition.  Will get help right away if you are not doing well or get worse.   This information is not intended to replace advice given to you by your health care provider. Make sure you discuss any questions you have with your health care provider.   Document Released: 01/21/2005 Document  Revised: 03/07/2012 Document Reviewed: 06/28/2015 Elsevier Interactive Patient Education [employer]

## 2015-10-15 NOTE — Progress Notes (Signed)
Laurie [name] is a 39 y.o. female who presents to [name]  today for cough congestion and runny nose. Symptoms present for 2 days. No fevers chills nausea vomiting or diarrhea. She's tried some aspirin which did not help much. No chest pain palpitations shortness of breath. She feels well otherwise.   Past Medical History  Diagnosis Date  . Obesity   . Endometriosis   . Meningitis     spinal  . History of kidney stones 08/25/2013   Past Surgical History  Procedure Laterality Date  . Abdominal hysterectomy    . Tonsillectomy    . Cholecystectomy    . Bilateral tubal       x2  . Ectopic pregnancy surgery     Social History  Substance Use Topics  . Smoking status: Former Smoker -- 2.00 packs/day for 20 years    Types: Cigarettes  . Smokeless tobacco: Never Used  . Alcohol Use: 0.0 oz/week    2-3 Cans of beer per week     Comment: daily   family history includes Alcohol abuse in her father; Cancer in her maternal grandfather and paternal grandfather; Diabetes in her father and paternal grandmother; Hyperlipidemia in her maternal grandfather and maternal grandmother; Hypertension in her maternal grandfather and maternal grandmother; Thyroid disease in her maternal grandmother and mother.  ROS as above Medications: Current Outpatient Prescriptions  Medication Sig Dispense Refill  . ALPRAZolam (XANAX) 0.5 MG tablet One tab PO as needed 10 tablet 0  . Multiple Vitamin (MULTIVITAMIN) capsule Take 1 capsule by mouth daily.    . Phentermine-Topiramate (QSYMIA) 15-92 MG CP24 Take 1 tablet by mouth daily. 30 capsule 2  . valACYclovir (VALTREX) 1000 MG tablet Take 1 tablet (1,000 mg total) by mouth 2 (two) times daily. 14 tablet 12  . zolpidem (AMBIEN) 10 MG tablet Take 10 mg by mouth. Take one tablet by month at bedtime as needed    . ipratropium (ATROVENT) 0.06 % nasal spray Place 2 sprays into both nostrils 4 (four) times daily. 15 mL 1  .  predniSONE (DELTASONE) 10 MG tablet Take 3 tablets (30 mg total) by mouth daily. 15 tablet 0   No current facility-administered medications for this visit.   No Known Allergies   Exam:  BP 152/94 mmHg  Pulse 114  Wt 269 lb (122.018 kg) Gen: Well NAD HEENT: EOMI,  MMM clear nasal discharge. Posterior pharynx with cobblestoning. Normal tympanic membranes bilaterally. Lungs: Normal work of breathing. CTABL Heart: Mild tachycardia. Regular rhythm no MRG Abd: NABS, Soft. Nondistended, Nontender Exts: Brisk capillary refill, warm and well perfused.   No results found for this or any previous visit (from the past 24 hour(s)). No results found.   Please see individual assessment and plan sections.

## 2015-10-15 NOTE — Assessment & Plan Note (Signed)
Symptoms very likely related to viral sinusitis. Trial of prednisone and Atrovent nasal spray. Doubtful for antibiotics at this time. Elevated heart rate has been present previously. Patient does not have any red flag signs or symptoms. Follow-up with PCP regarding hypertension and heart rate.

## 2015-11-14 ENCOUNTER — Ambulatory Visit (INDEPENDENT_AMBULATORY_CARE_PROVIDER_SITE_OTHER): Payer: BLUE CROSS/BLUE SHIELD | Admitting: Osteopathic Medicine

## 2015-11-14 ENCOUNTER — Encounter: Payer: Self-pay | Admitting: Osteopathic Medicine

## 2015-11-14 VITALS — BP 129/75 | HR 86 | Temp 97.5°F | Ht 71.0 in | Wt 276.0 lb

## 2015-11-14 DIAGNOSIS — B349 Viral infection, unspecified: Secondary | ICD-10-CM

## 2015-11-14 DIAGNOSIS — B9789 Other viral agents as the cause of diseases classified elsewhere: Secondary | ICD-10-CM

## 2015-11-14 DIAGNOSIS — J329 Chronic sinusitis, unspecified: Secondary | ICD-10-CM

## 2015-11-14 DIAGNOSIS — J069 Acute upper respiratory infection, unspecified: Secondary | ICD-10-CM | POA: Diagnosis not present

## 2015-11-14 MED ORDER — BENZONATATE 200 MG PO CAPS: 200.0000 mg | ORAL_CAPSULE | Freq: Three times a day (TID) | ORAL | Status: DC | PRN

## 2015-11-14 NOTE — Progress Notes (Signed)
Eye exam: R eye (+) conjunctival injection very mild, sclera otherwise clear, PERRLA, no suspicion for bacterial conjunctivitis

## 2015-11-14 NOTE — Progress Notes (Signed)
HPI: Laurie [name] is a 39 y.o. female who [facility] [name] for chief complaint of:  Chief Complaint  Patient presents with  . Eye Problem    right eye  . Sinus Problem   R eye . Quality: red, nonpainful . Severity: mild to moderate . Duration: 1 day . Context: no injury . Assoc signs/symptoms: sinus congestion and cough  Congestion . Location: sinuses, chest . Quality: congestion, runny nose, ear fullness . Duration: 1 day . Timing: constant . Modifying factors: no OTC meds tried except aspirin . Assoc signs/symptoms: dry cough, runny nose, sneezing    Past medical, social and family history reviewed: Past Medical History  Diagnosis Date  . Obesity   . Endometriosis   . Meningitis     spinal  . History of kidney stones 08/25/2013   Past Surgical History  Procedure Laterality Date  . Abdominal hysterectomy    . Tonsillectomy    . Cholecystectomy    . Bilateral tubal       x2  . Ectopic pregnancy surgery     Social History  Substance Use Topics  . Smoking status: Former Smoker -- 2.00 packs/day for 20 years    Types: Cigarettes  . Smokeless tobacco: Never Used  . Alcohol Use: 0.0 oz/week    2-3 Cans of beer per week     Comment: daily   Family History  Problem Relation Age of Onset  . Thyroid disease Mother   . Cancer Paternal Grandfather     colon  . Cancer Maternal Grandfather     colon  . Hyperlipidemia Maternal Grandfather   . Hypertension Maternal Grandfather   . Alcohol abuse Father   . Diabetes Father   . Hyperlipidemia Maternal Grandmother   . Hypertension Maternal Grandmother   . Thyroid disease Maternal Grandmother   . Diabetes Paternal Grandmother     Current Outpatient Prescriptions  Medication Sig Dispense Refill  . ALPRAZolam (XANAX) 0.5 MG tablet One tab PO as needed 10 tablet 0  . ipratropium (ATROVENT) 0.06 % nasal spray Place 2 sprays into both nostrils 4 (four) times daily. 15  mL 1  . Multiple Vitamin (MULTIVITAMIN) capsule Take 1 capsule by mouth daily.    . Phentermine-Topiramate (QSYMIA) 15-92 MG CP24 Take 1 tablet by mouth daily. 30 capsule 2  . valACYclovir (VALTREX) 1000 MG tablet Take 1 tablet (1,000 mg total) by mouth 2 (two) times daily. 14 tablet 12  . zolpidem (AMBIEN) 10 MG tablet Take 10 mg by mouth. Take one tablet by month at bedtime as needed    . benzonatate (TESSALON) 200 MG capsule Take 1 capsule (200 mg total) by mouth 3 (three) times daily as needed for cough. 30 capsule 0   No current facility-administered medications for this visit.   No Known Allergies    Review of Systems: CONSTITUTIONAL:  No  fever, no chills, No  unintentional weight changes HEAD/EYES/EARS/NOSE/THROAT: No headache, no vision change, no hearing change, No  sore throat, (+) sinus congestion as per HPI CARDIAC: No chest pain, no pressure/palpitations, no orthopnea RESPIRATORY: mild dry cough, No  shortness of breath/wheeze GASTROINTESTINAL: No nausea, no vomiting, no abdominal pain,   Exam:  BP 129/75 mmHg  Pulse 86  Temp(Src) 97.5 F (36.4 C) (Oral)  Ht 5\' 11"  (1.803 m)  Wt 276 lb (125.193 kg)  BMI 38.51 kg/m2 Constitutional: VSS, see above. General Appearance: alert, well-developed, well-nourished, NAD Eyes: Normal lids and conjunctive, non-icteric  sclera, PERRLA Ears, Nose, Mouth, Throat: Normal external inspection ears/nares/mouth/lips/gums, TM normal bilaterally, MMM, posterior pharynx No  erythema No  exudate Neck: No masses, trachea midline. No thyroid enlargement/tenderness/mass appreciated. No lymphadenopathy Respiratory: Normal respiratory effort. no wheeze, no rhonchi, no rales Cardiovascular: S1/S2 normal, no murmur, no rub/gallop auscultated. RRR.    No results found for this or any previous visit (from the past 72 hour(s)).    ASSESSMENT/PLAN:  Viral URI with cough - Plan: benzonatate (TESSALON) 200 MG capsule  Viral sinusitis   Education  on virus vs bacteria, info printed on supportive care and OTC medicines to use, call if no better in 1 week call and we will reevaluate for abx at that time    Return if symptoms worsen or fail to improve.

## 2015-11-14 NOTE — Patient Instructions (Signed)
DR. [facility] INSTRUCTIONS: UPPER RESPIRATORY ILLNESS AND SINUSITIS   TREAT SINUS CONGESTION, RUNNY NOSE & POSTNASAL DRIP: treat to increase airflow through sinuses, decrease congestion pain and prevent bacterial growth! Remember, only 0.5-2% of sinus infections are due to a bacteria, the rest are due to a virus (usually the common cold)! Trust your doctor when he or she decides whether or not you really need an antibiotic!   NASAL SPRAYS: generally safe, won't interact with other medicines. Can take any of these medications, either alone or together...  FLONASE (FLUTICASONA) - 2 sprays in each nostril twice per day  AFRIN (OXYMETOLAZONE) - use sparingly because it will cause rebound congestion, NEVER USE IN KIDS   SALINE NASAL SPRAY- no limit but avoid use after other nasal sprays  ANTIHISTAMINES: Helps dry out runny nose and decreases postnasal drip. Benadryl may cause drowsiness but other preparations should not be as sedating. Certain kinds are not as safe in elderly individuals. OK to use unless Dr A says otherwise.  Only use one of the following...  BENADRYL (DIPHENHYDRAMINE) - 25-50 mg every 6 hours  ZYRTEC (CETIRIZINE) - 5-10 mg daily  CLARITIN (LORATIDINE) - less potent. 10 mg daily  ALLEGRA (FEXOFENADINE) - least sedating! 180 mg daily or 60 mg twice per day  DECONGESTANTS:Helps dry out runny nose and helps with sinus pain. May cause insomnia, or sometimes elevated heart rate. Can cause problems if used often in people with high blood pressure. OK to use unless Dr A says otherwise.   SUDAFED (PSEUDOEPHEDINE) - 60 mg every 4 - 6 hours, also comes in 120 mg extended release every 12 hours, maximum 240 mg in 24 hours. NEVER USE IN KIDS UNDER 38 YEARS OLD.   Note: decongestants such as Sudafed and others are included in many drug combinations - read labels carefully and ask your pharmacist if you have any questions or concerns about interactions or buying duplicate  medicines!  COMBINATIONS OF ANTIHISTAMINES + DECONGESTANT: these often require you to show your ID at the pharmacy counter.  Only use one of the following...  ZYRTEC-D (CETIRIZINE + PSEUDOEPHEDRINE) - 12 hour formulation as directed  CLARITIN-D (LORATIDINE + PSEUDOEPHEDRINE) - 12 and 24 hour formulations as directed  ALLEGRA-D (FEXOFENADINE + PSEUDOEPHEDRINE) - 12 and 24 hour formulations as directed  PRESCRIPTION TREATMENT Reserved for severe cases or true bacterial infection. See your medication list below! Can include nasal sprays, pills, or antibiotics in the case of true bacterial infection. Dr A may recommend certain over-the-counter medications in addition to prescriptions to help you feel better.     TREAT COUGH & SORE THROAT: Remember, cough is the body's way of protecting your airways and lungs, it's a hard-wired reflex that is tough for medicines to treat! Irritation to the airways will cause cough. This irritation is usually caused by upper airway problems like postnasal drip (treat as above) and sore throat, but in severe cases can be due to lower airway problems like bronchitis or pneumonia. Sore throat is almost always due to a virus, but occasionally can be due to Strep infection which requires antibiotics. Exercise and smoking may make cough worse - take it easy and QUIT SMOKING! Cough due to viral infection can linger for 2 weeks or so. If you're coughing longer than you think you should, or if the cough is severe, please make an appointment in the office, you may need a chest X-ray.   EXPECTORANT: Used to help clear airways, take these with PLENTY  of water to help thin mucus secretions and make the mucus easier to cough up  Only use one of the following...  ROBITUSSIN (DEXTROMETHORPHAN OR GUAIFENISEN depending on formulation)   MUCINEX (GUAIFENICEN) - usually longer acting  Note: Robitussin and Mucinex come in many combinations, also - read labels carefully and ask your  pharmacist if you have any questions or concerns about interactions or buying duplicate medicines!  COUGH DROPS/LOZENGES: Whichever over-the-counter agent you prefer! Here are some suggestions for ingredients to look for...  BENZOCAINE - numbing effect, also in CHLORASEPTIC THROAT SPRAY  MENTHOL - cooling effect  HONEY: has gone head-to-head in several clinical trials with prescription cough medicines and found to be equally effective! Try 1 Teaspoon Honey + 2 Drops Lemon Juice, as much as you want to use. AVOID IN KIDS UNDER AGE 46  HERBAL TEA: There are certain ingredients which help "coat the throat" to relieve pain such as ELM BARK, LICORICE ROOT, MARSHMALLOW ROOT  PRESCRIPTION TREATMENT Reserved for severe cases. Can include pills, syrups or inhalers. See your medication list below!      TREAT ACHES AND PAINS, FEVER: Illness causes aches, pains and fever as your body increases inflammation response to help fight the infection. Rest, good hydration and nutrition, and taking anti-inflammatory medications will help. Remember: a true fever is a temperature 100.4 or higher. If you have a fever that is 105.0 or higher, that is a dangerous level and needs medical attention in the office or in the ER!    Can take both of these together...  IBUPROFEN - 400-800 mg every 6 - 8 hours. Ibuprofen and similar medications can cause problems for people with heart disease or history of stomach ulcers, check with Dr A first if you're concerned. Lower doses are usually safe and effective.   TYLENOL (ACETAMINOPHEN) - [phone] mg every 4 - 6 hours. This won't cause problems with heart or stomach.   Note: Ibuprofen and Acetaminophen are often combined with many other cold medicines - read labels carefully and ask your pharmacist if you have any questions or concerns about interactions or buying duplicate medicines!     ANYTHING HIGHLIGHTED - YOU CAN TAKE THESE MEDICINES TOGETHER!     USE CAUTION -  MANY OVER-THE-COUNTER MEDICATIONS COME IN COMBINATIONS OF MULTIPLE GENERICS. FOR INSTANCE, NYQUIL HAS TYLENOL + ROBITUSSIN + AN ANTIHISTAMINE, SO BE CAREFUL YOU'RE NOT TAKING A COMBINATION MEDICINE WHICH CONTAINS MEDICATIONS YOU'RE ALSO TAKING SEPARATELY (LIKE NYQUIL SYRUP AS WELL AS TYLENOL PILLS). YOUR PHARMACIST CAN HELP YOU AVOID MEDICATION INTERACTIONS AND DUPLICATIONS - ASK FOR THEIR HELP IF YOU ARE CONFUSED OR UNSURE ABOUT WHAT TO PURCHASE OVER-THE-COUNTER!    REMEMBER - THE MOST IMPORTANT THINGS YOU CAN DO TO AVOID CATCHING OR SPREADING ILLNESS INCLUDE:   COVER YOUR COUGH WITH YOUR ARM  DISINFECT COMMONLY USED SURFACES SUCH AS TELEPHONES OR DOORKNOBS WHEN YOU OR SOMEONE LIVING OR WORKING WITH YOU IS SICK  BE SURE VACCINES ARE UP TO DATE - GET A FLU SHOT EVERY YEAR!  AND ABOVE ALL - WASH YOUR HANDS!   REMEMBER - IF YOU'RE EVER CONCERNED ABOUT MEDICATION SIDE EFFECTS, OR IF YOU'RE EVER CONCERNED YOUR SYMPTOMS ARE GETTING WORSE DESPITE TREATMENT, PLEASE CALL THE OFFICE!

## 2015-12-18 ENCOUNTER — Encounter: Payer: Self-pay | Admitting: Family Medicine

## 2015-12-18 ENCOUNTER — Ambulatory Visit (INDEPENDENT_AMBULATORY_CARE_PROVIDER_SITE_OTHER): Payer: BLUE CROSS/BLUE SHIELD | Admitting: Family Medicine

## 2015-12-18 VITALS — BP 134/88 | HR 95 | Wt 274.0 lb

## 2015-12-18 DIAGNOSIS — Z0000 Encounter for general adult medical examination without abnormal findings: Secondary | ICD-10-CM

## 2015-12-18 DIAGNOSIS — Z86010 Personal history of colonic polyps: Secondary | ICD-10-CM

## 2015-12-18 LAB — COMPLETE METABOLIC PANEL WITH GFR
ALT: 37 U/L — ABNORMAL HIGH (ref 6–29)
AST: 27 U/L (ref 10–30)
Albumin: 4.2 g/dL (ref 3.6–5.1)
Alkaline Phosphatase: 109 U/L (ref 33–115)
BUN: 9 mg/dL (ref 7–25)
CHLORIDE: 100 mmol/L (ref 98–110)
CO2: 27 mmol/L (ref 20–31)
Calcium: 9.6 mg/dL (ref 8.6–10.2)
Creat: 0.77 mg/dL (ref 0.50–1.10)
GFR, Est African American: >89 mL/min (ref >=60)
GLUCOSE: 93 mg/dL (ref 65–99)
POTASSIUM: 4.1 mmol/L (ref 3.5–5.3)
SODIUM: 137 mmol/L (ref 135–146)
TOTAL PROTEIN: 7.1 g/dL (ref 6.1–8.1)
Total Bilirubin: 0.7 mg/dL (ref 0.2–1.2)

## 2015-12-18 LAB — LIPID PANEL
Cholesterol: 247 mg/dL — ABNORMAL HIGH (ref 125–200)
HDL: 48 mg/dL (ref >=46)
LDL CALC: 168 mg/dL — AB (ref <130)
Total CHOL/HDL Ratio: 5.1 Ratio — ABNORMAL HIGH (ref <=5.0)
Triglycerides: 155 mg/dL — ABNORMAL HIGH (ref <150)
VLDL: 31 mg/dL — ABNORMAL HIGH (ref <30)

## 2015-12-18 MED ORDER — TRAMADOL HCL 50 MG PO TABS: 50.0000 mg | ORAL_TABLET | Freq: Three times a day (TID) | ORAL | Status: DC | PRN

## 2015-12-18 NOTE — Progress Notes (Signed)
CC: Laurie MylarKrystal Sinn is a 39 y.o. female is here for Annual Exam   Subjective: HPI:  Colonoscopy: UTD Papsmear: (UTD Mammogram: No current indication   Influenza Vaccine: declined Pneumovax: no current indication Td/Tdap: UTD Zoster: (Start 38 yo)  Requesting complete physical exam with no acute complaints but would like a refill on tramadol. She uses 1 or 2 times a week for knee pain. A prescription from me his last her up until today.  Review of Systems - General ROS: negative for - chills, fever, night sweats, weight gain or weight loss Ophthalmic ROS: negative for - decreased vision Psychological ROS: negative for - anxiety or depression ENT ROS: negative for - hearing change, nasal congestion, tinnitus or allergies Hematological and Lymphatic ROS: negative for - bleeding problems, bruising or swollen lymph nodes Breast ROS: negative Respiratory ROS: no cough, shortness of breath, or wheezing Cardiovascular ROS: no chest pain or dyspnea on exertion Gastrointestinal ROS: no abdominal pain, change in bowel habits, or black or bloody stools Genito-Urinary ROS: negative for - genital discharge, genital ulcers, incontinence or abnormal bleeding from genitals Musculoskeletal ROS: negative for - joint pain or muscle pain other than that described above Neurological ROS: negative for - headaches or memory loss Dermatological ROS: negative for lumps, mole changes, rash and skin lesion changes  Past Medical History  Diagnosis Date  . Obesity   . Endometriosis   . Meningitis     spinal  . History of kidney stones 08/25/2013    Past Surgical History  Procedure Laterality Date  . Abdominal hysterectomy    . Tonsillectomy    . Cholecystectomy    . Bilateral tubal       x2  . Ectopic pregnancy surgery     Family History  Problem Relation Age of Onset  . Thyroid disease Mother   . Cancer Paternal Grandfather     colon  . Cancer Maternal Grandfather     colon  .  Hyperlipidemia Maternal Grandfather   . Hypertension Maternal Grandfather   . Alcohol abuse Father   . Diabetes Father   . Hyperlipidemia Maternal Grandmother   . Hypertension Maternal Grandmother   . Thyroid disease Maternal Grandmother   . Diabetes Paternal Grandmother     Social History   Social History  . Marital Status: Married    Spouse Name: N/A  . Number of Children: N/A  . Years of Education: N/A   Occupational History  . Not on file.   Social History Main Topics  . Smoking status: Former Smoker -- 2.00 packs/day for 20 years    Types: Cigarettes  . Smokeless tobacco: Never Used  . Alcohol Use: 0.0 oz/week    2-3 Cans of beer per week     Comment: daily  . Drug Use: No  . Sexual Activity:    Partners: Male    Birth Control/ Protection: Surgical   Other Topics Concern  . Not on file   Social History Narrative     Objective: BP 134/88 mmHg  Pulse 95  Wt 274 lb (124.286 kg)  General: No Acute Distress HEENT: Atraumatic, normocephalic, conjunctivae normal without scleral icterus.  No nasal discharge, hearing grossly intact, TMs with good landmarks bilaterally with no middle ear abnormalities, posterior pharynx clear without oral lesions. Neck: Supple, trachea midline, no cervical nor supraclavicular adenopathy. Pulmonary: Clear to auscultation bilaterally without wheezing, rhonchi, nor rales. Cardiac: Regular rate and rhythm.  No murmurs, rubs, nor gallops. No peripheral edema.  2+ peripheral pulses  bilaterally. Abdomen: Bowel sounds normal.  No masses.  Non-tender without rebound.  Negative Murphy's sign. MSK: Grossly intact, no signs of weakness.  Full strength throughout upper and lower extremities.  Full ROM in upper and lower extremities.  No midline spinal tenderness. Neuro: Gait unremarkable, CN II-XII grossly intact.  C5-C6 Reflex 2/4 Bilaterally, L4 Reflex 2/4 Bilaterally.  Cerebellar function intact. Skin: No rashes. Psych: Alert and oriented to  person/place/time.  Thought process normal. No anxiety/depression.   Assessment & Plan: Philicia was seen today for annual exam.  Diagnoses and all orders for this visit:  Annual physical exam -     Lipid panel -     traMADol (ULTRAM) 50 MG tablet; Take 1 tablet (50 mg total) by mouth every 8 (eight) hours as needed. -     COMPLETE METABOLIC PANEL WITH GFR  History of colonic polyps   Healthy lifestyle interventions including but not limited to regular exercise, a healthy low fat diet, moderation of salt intake, the dangers of tobacco/alcohol/recreational drug use, nutrition supplementation, and accident avoidance were discussed with the patient and a handout was provided for future reference.  Return in about 1 year (around 12/17/2016).

## 2017-05-13 DIAGNOSIS — M549 Dorsalgia, unspecified: Secondary | ICD-10-CM | POA: Insufficient documentation

## 2018-01-28 DIAGNOSIS — M25561 Pain in right knee: Secondary | ICD-10-CM | POA: Insufficient documentation

## 2018-10-06 ENCOUNTER — Other Ambulatory Visit: Payer: Self-pay

## 2018-10-06 ENCOUNTER — Emergency Department
Admission: EM | Admit: 2018-10-06 | Discharge: 2018-10-06 | Disposition: A | Discharge status: Home / Self Care | Payer: Managed Care, Other (non HMO) | Source: Home / Self Care | Attending: Family Medicine | Admitting: Family Medicine

## 2018-10-06 DIAGNOSIS — R6889 Other general symptoms and signs: Secondary | ICD-10-CM | POA: Diagnosis not present

## 2018-10-06 LAB — POCT INFLUENZA A/B
Influenza A, POC: NEGATIVE
Influenza B, POC: NEGATIVE

## 2018-10-06 LAB — POCT RAPID STREP A (OFFICE): Rapid Strep A Screen: NEGATIVE

## 2018-10-06 NOTE — ED Triage Notes (Signed)
Yesterday headache and fever, today sore throat and generalized fever

## 2018-10-06 NOTE — ED Provider Notes (Signed)
[name]    CSN: [id number] Arrival date & time: 10/06/18  1610     History   Chief Complaint Chief Complaint  Patient presents with  . Fever  . Sore Throat  . Headache    HPI Laurie [name] is a 42 y.o. female.   HPI  Laurie [name] is a 42 y.o. female presenting to UC with c/o sudden onset generalized HA, fever Tmax 101*F, sore throat and mild congestion.  Others at work have been sick with "everything" including the flu and bronchitis. No known exposure to strep throat. She has taken Tylenol with mild relief. Hx of the flu in the past. Symptoms feel similar. She has not received the flu vaccine this year.   Past Medical History:  Diagnosis Date  . Endometriosis   . History of kidney stones 08/25/2013  . Meningitis    spinal  . Obesity     Patient Active Problem List   Diagnosis Date Noted  . History of colonic polyps 12/18/2015  . Viral sinusitis 10/15/2015  . Pancreatitis, acute 08/16/2015  . Primary osteoarthritis of both knees 03/11/2015  . Poor concentration 10/30/2014  . Breast lump 05/29/2014  . OSA (obstructive sleep apnea) 02/02/2014  . Obesity 11/09/2013  . Insulin resistance 11/09/2013  . History of kidney stones 08/25/2013  . Chronic constipation 08/04/2013  . Spinal headache 12/02/2011    Past Surgical History:  Procedure Laterality Date  . ABDOMINAL HYSTERECTOMY    . bilateral tubal      x2  . CHOLECYSTECTOMY    . ECTOPIC PREGNANCY SURGERY    . TONSILLECTOMY      OB History    Gravida  2   Para  1   Term      Preterm      AB  1   Living  2     SAB      TAB      Ectopic  1   Multiple      Live Births               Home Medications    Prior to Admission medications   Medication Sig Start Date End Date Taking? Authorizing Provider  ALPRAZolam Prudy Feeler) 0.5 MG tablet One tab PO as needed 08/09/15   Monica Becton, MD  benzonatate (TESSALON) 200 MG capsule Take 1 capsule (200 mg total) by  mouth 3 (three) times daily as needed for cough. 11/14/15   Sunnie Nielsen, DO  ipratropium (ATROVENT) 0.06 % nasal spray Place 2 sprays into both nostrils 4 (four) times daily. 10/15/15   Rodolph Bong, MD  Multiple Vitamin (MULTIVITAMIN) capsule Take 1 capsule by mouth daily.    [provider]  Phentermine-Topiramate (QSYMIA) 15-92 MG CP24 Take 1 tablet by mouth daily. 05/24/15   Hommel, Gregary Signs, DO  traMADol (ULTRAM) 50 MG tablet Take 1 tablet (50 mg total) by mouth every 8 (eight) hours as needed. 12/18/15   Laren Boom, DO  valACYclovir (VALTREX) 1000 MG tablet Take 1 tablet (1,000 mg total) by mouth 2 (two) times daily. 01/30/15   Allie Bossier, MD  zolpidem (AMBIEN) 10 MG tablet Take 10 mg by mouth. Take one tablet by month at bedtime as needed    [provider]    Family History Family History  Problem Relation Age of Onset  . Thyroid disease Mother   . Cancer Paternal Grandfather        colon  . Cancer Maternal Grandfather  colon  . Hyperlipidemia Maternal Grandfather   . Hypertension Maternal Grandfather   . Alcohol abuse Father   . Diabetes Father   . Hyperlipidemia Maternal Grandmother   . Hypertension Maternal Grandmother   . Thyroid disease Maternal Grandmother   . Diabetes Paternal Grandmother     Social History Social History   Tobacco Use  . Smoking status: Former Smoker    Packs/day: 2.00    Years: 20.00    Pack years: 40.00    Types: Cigarettes  . Smokeless tobacco: Never Used  Substance Use Topics  . Alcohol use: Yes    Alcohol/week: 2.0 - 3.0 standard drinks    Types: 2 - 3 Cans of beer per week    Comment: daily  . Drug use: No     Allergies   Patient has no known allergies.   Review of Systems Review of Systems  Constitutional: Positive for appetite change, chills, fatigue and fever.  HENT: Positive for congestion and sore throat. Negative for ear pain, trouble swallowing and voice change.   Respiratory: Negative for  cough and shortness of breath.   Cardiovascular: Negative for chest pain and palpitations.  Gastrointestinal: Negative for abdominal pain, diarrhea, nausea and vomiting.  Musculoskeletal: Positive for arthralgias and myalgias. Negative for back pain.  Skin: Negative for rash.  Neurological: Positive for light-headedness and headaches. Negative for dizziness.     Physical Exam Triage Vital Signs ED Triage Vitals  Enc Vitals Group     BP 10/06/18 1639 (!) 133/91     Pulse Rate 10/06/18 1639 100     Resp --      Temp 10/06/18 1639 97.8 F (36.6 C)     Temp Source 10/06/18 1639 Oral     SpO2 10/06/18 1639 100 %     Weight 10/06/18 1642 277 lb (125.6 kg)     Height 10/06/18 1642 5\' 11"  (1.803 m)     Head Circumference --      Peak Flow --      Pain Score 10/06/18 1642 0     Pain Loc --      Pain Edu? --      Excl. in GC? --    No data found.  Updated Vital Signs BP (!) 133/91 (BP Location: Right Arm)   Pulse 100   Temp 97.8 F (36.6 C) (Oral)   Ht 5\' 11"  (1.803 m)   Wt 277 lb (125.6 kg)   SpO2 100%   BMI 38.63 kg/m   Visual Acuity Right Eye Distance:   Left Eye Distance:   Bilateral Distance:    Right Eye Near:   Left Eye Near:    Bilateral Near:     Physical Exam  Constitutional: She is oriented to person, place, and time. She appears well-developed and well-nourished.  Non-toxic appearance. She does not appear ill. No distress.  HENT:  Head: Normocephalic and atraumatic.  Right Ear: Tympanic membrane normal.  Left Ear: Tympanic membrane normal.  Nose: Nose normal. Right sinus exhibits no maxillary sinus tenderness and no frontal sinus tenderness. Left sinus exhibits no maxillary sinus tenderness and no frontal sinus tenderness.  Mouth/Throat: Uvula is midline and mucous membranes are normal. Posterior oropharyngeal erythema present.  Eyes: EOM are normal.  Neck: Normal range of motion. Neck supple.  Cardiovascular: Normal rate and regular rhythm.    Pulmonary/Chest: Effort normal and breath sounds normal. No respiratory distress. She has no wheezes. She has no rhonchi.  Musculoskeletal: Normal range of motion.  Neurological: She is alert and oriented to person, place, and time.  Skin: Skin is warm and dry.  Psychiatric: She has a normal mood and affect. Her behavior is normal.  Nursing note and vitals reviewed.    UC Treatments / Results  Labs (all labs ordered are listed, but only abnormal results are displayed) Labs Reviewed  POCT INFLUENZA A/B  POCT RAPID STREP A (OFFICE)    EKG None  Radiology No results found.  Procedures Procedures (including critical care time)  Medications Ordered in UC Medications - No data to display  Initial Impression / Assessment and Plan / UC Course  I have reviewed the triage vital signs and the nursing notes.  Pertinent labs & imaging results that were available during my care of the patient were reviewed by me and considered in my medical decision making (see chart for details).     Rapid strep and flu: NEGATIVE No evidence of bacterial infection at this time. Symptoms c/w viral illness Encouraged symptomatic treatment at this time. Work note provided for today and tomorow  Final Clinical Impressions(s) / UC Diagnoses   Final diagnoses:  Flu-like symptoms     Discharge Instructions     You may take 500mg  acetaminophen every 4-6 hours or in combination with ibuprofen 400-600mg  every 6-8 hours as needed for pain, inflammation, and fever.  Be sure to stay well hydrated and get at least 8 hours of sleep at night, preferably more while sick.   Please follow up with family medicine in 1 week as needed.     ED Prescriptions    None     Controlled Substance Prescriptions [place] Controlled Substance Registry consulted? Not Applicable   Rolla Plate 10/06/18 1731

## 2018-10-06 NOTE — Discharge Instructions (Signed)
You may take 500mg  acetaminophen every 4-6 hours or in combination with ibuprofen 400-600mg  every 6-8 hours as needed for pain, inflammation, and fever.  Be sure to stay well hydrated and get at least 8 hours of sleep at night, preferably more while sick.   Please follow up with family medicine in 1 week as needed.

## 2018-10-17 ENCOUNTER — Ambulatory Visit: Payer: Managed Care, Other (non HMO) | Admitting: Physician Assistant

## 2018-10-17 ENCOUNTER — Encounter: Payer: Self-pay | Admitting: Physician Assistant

## 2018-10-17 VITALS — BP 133/85 | HR 76 | Ht 71.0 in | Wt 281.0 lb

## 2018-10-17 DIAGNOSIS — Z130 Encounter for screening for diseases of the blood and blood-forming organs and certain disorders involving the immune mechanism: Secondary | ICD-10-CM

## 2018-10-17 DIAGNOSIS — F909 Attention-deficit hyperactivity disorder, unspecified type: Secondary | ICD-10-CM | POA: Diagnosis not present

## 2018-10-17 DIAGNOSIS — A6000 Herpesviral infection of urogenital system, unspecified: Secondary | ICD-10-CM

## 2018-10-17 DIAGNOSIS — Z7689 Persons encountering health services in other specified circumstances: Secondary | ICD-10-CM

## 2018-10-17 DIAGNOSIS — Z86010 Personal history of colonic polyps: Secondary | ICD-10-CM

## 2018-10-17 DIAGNOSIS — R030 Elevated blood-pressure reading, without diagnosis of hypertension: Secondary | ICD-10-CM

## 2018-10-17 DIAGNOSIS — E8881 Metabolic syndrome: Secondary | ICD-10-CM

## 2018-10-17 DIAGNOSIS — Z90710 Acquired absence of both cervix and uterus: Secondary | ICD-10-CM | POA: Diagnosis not present

## 2018-10-17 DIAGNOSIS — Z13220 Encounter for screening for lipoid disorders: Secondary | ICD-10-CM

## 2018-10-17 DIAGNOSIS — Z1231 Encounter for screening mammogram for malignant neoplasm of breast: Secondary | ICD-10-CM

## 2018-10-17 DIAGNOSIS — F40243 Fear of flying: Secondary | ICD-10-CM | POA: Insufficient documentation

## 2018-10-17 DIAGNOSIS — M25561 Pain in right knee: Secondary | ICD-10-CM

## 2018-10-17 HISTORY — DX: Herpesviral infection of urogenital system, unspecified: A60.00

## 2018-10-17 MED ORDER — LISDEXAMFETAMINE DIMESYLATE 60 MG PO CAPS: 60.0000 mg | ORAL_CAPSULE | ORAL | 0 refills | Status: DC

## 2018-10-17 NOTE — Progress Notes (Signed)
HPI:                                                                Laurie [name] is a 42 y.o. female who presents to [facility] today to establish care  Current concerns: medication refills  Reports trip and fall down a flight of stairs at home last Wednesday. She has chronic right knee pain s/p arthroscopy and states the fall "jacked it up." Denies any other falls in the last year. She takes Meloxicam as needed for knee pain.  ADHD: reports she was tested for ADHD in her late 20's. She has been taking Vyvanse 60 mg daily for over 10 years. Denies palpitations, chest pain, headache, sleep disturbance.  OSA: reports her CPAP machine died approx 1 year ago.   Hx of colon polyps: colonoscopy Q3years. Family hx of colon cancer in both grandparents.   Depression screen PHQ 2/9 10/17/2018  Decreased Interest 0  Down, Depressed, Hopeless 0  PHQ - 2 Score 0    No flowsheet data found.    Past Medical History:  Diagnosis Date  . Endometriosis   . Flying phobia   . History of colon polyps   . History of kidney stones 08/25/2013  . Meningitis    spinal  . Obesity    Past Surgical History:  Procedure Laterality Date  . ABDOMINAL HYSTERECTOMY    . bilateral tubal      x2  . CHOLECYSTECTOMY    . ECTOPIC PREGNANCY SURGERY    . KNEE ARTHROSCOPY Right   . TONSILLECTOMY    . TUBAL LIGATION     Social History   Tobacco Use  . Smoking status: Former Smoker    Packs/day: 2.00    Years: 20.00    Pack years: 40.00    Types: Cigarettes    Last attempt to quit: 10/18/2007    Years since quitting: 11.0  . Smokeless tobacco: Never Used  Substance Use Topics  . Alcohol use: Yes    Alcohol/week: 5.0 - 7.0 standard drinks    Types: 5 - 7 Standard drinks or equivalent per week    Comment: weekly   family history includes Alcohol abuse in her father; Cancer in her maternal grandfather and paternal grandfather; Colon cancer in her  maternal grandfather and paternal grandfather; Diabetes in her father and paternal grandmother; Hyperlipidemia in her maternal grandfather and maternal grandmother; Hypertension in her maternal grandfather and maternal grandmother; Throat cancer in her maternal grandfather; Thyroid disease in her maternal grandmother and mother.    ROS: Review of Systems  Musculoskeletal: Positive for back pain and joint pain (bilateral knees, R>>L).  All other systems reviewed and are negative.    Medications: Current Outpatient Medications  Medication Sig Dispense Refill  . ALPRAZolam (XANAX) 0.5 MG tablet One tab PO as needed 10 tablet 0  . lisdexamfetamine (VYVANSE) 60 MG capsule Take 1 capsule by mouth every morning.    . meloxicam (MOBIC) 15 MG tablet Take 1 tablet by mouth daily as needed.    . valACYclovir (VALTREX) 1000 MG tablet Take 1 tablet (1,000 mg total) by mouth 2 (two) times daily. 14 tablet 12   No current facility-administered medications for this visit.    No  Known Allergies     Objective:  BP 133/85   Pulse 76   Ht 5\' 11"  (1.803 m)   Wt 281 lb (127.5 kg)   BMI 39.19 kg/m  Gen:  alert, not ill-appearing, no distress, appropriate for age, obese female HEENT: head normocephalic without obvious abnormality, conjunctiva and cornea clear, trachea midline Pulm: Normal work of breathing, normal phonation, clear to auscultation bilaterally, no wheezes, rales or rhonchi CV: Normal rate, regular rhythm, s1 and s2 distinct, no murmurs, clicks or rubs; radial pulses 2+ symmetric Neuro: alert and oriented x 3, no tremor MSK: extremities atraumatic, normal gait and station Skin: intact, no rashes on exposed skin, no jaundice, no cyanosis Psych: well-groomed, cooperative, good eye contact, euthymic mood, affect mood-congruent, speech is articulate, and thought processes clear and goal-directed    No results found for this or any previous visit (from the past 72 hour(s)). No results  found.    Assessment and Plan: 42 y.o. female with  .Ondrea was seen today for establish care.  Diagnoses and all orders for this visit:  Encounter to establish care  History of colon polyps  Adult ADHD -     lisdexamfetamine (VYVANSE) 60 MG capsule; Take 1 capsule (60 mg total) by mouth every morning.  History of hysterectomy for benign disease  Elevated blood pressure reading  Right knee pain, unspecified chronicity  Insulin resistance -     Hemoglobin A1c  Breast cancer screening by mammogram -     MM 3D SCREEN BREAST BILATERAL  Screening for blood disease -     CBC -     Comprehensive metabolic panel  Screening for lipid disorders -     Lipid Panel w/reflex Direct LDL   - Personally reviewed PMH, PSH, PFH, medications, allergies, HM - Age-appropriate cancer screening: no longer candidate for Pap smears, mammogram overdue, order placed; Colonoscopy Q3Y - Influenza declined - Tdap UTD - PHQ2 negative - Fasting labs pending  Elevated BP - BP out of range on 2 office checks today and has been in hypertensive range at prior office visits. Patient denies history of hypertension. She has untreated sleep apnea. Counseled on therapeutic lifestyle changes. Patient to monitor and log BP's at home.  Adult ADHD - checked NCCSRS, no red flags. Refill provided. F/u Q45months until BP is controlled  R knee pain - patient will schedule f/u appt with Dr. Karie Schwalbe. Cont Meloxicam prn  Patient education and anticipatory guidance given Patient agrees with treatment plan Follow-up in 3 months as needed if symptoms worsen or fail to improve  Levonne Hubert PA-C

## 2018-10-17 NOTE — Patient Instructions (Addendum)
For your blood pressure: - Goal <130/80 - monitor and log blood pressures at home - check around the same time each day in a relaxed setting - Limit salt to <2000 mg/day - Follow DASH eating plan - limit alcohol to 2 standard drinks per day for men and 1 per day for women - avoid tobacco products - weight loss: 7% of current body weight - follow-up every 6 months for your blood pressure   

## 2018-10-18 ENCOUNTER — Encounter: Payer: Self-pay | Admitting: Physician Assistant

## 2018-10-18 DIAGNOSIS — E782 Mixed hyperlipidemia: Secondary | ICD-10-CM | POA: Insufficient documentation

## 2018-10-18 LAB — CBC
HEMATOCRIT: 42.6 % (ref 35.0–45.0)
HEMOGLOBIN: 14.5 g/dL (ref 11.7–15.5)
MCH: 31 pg (ref 27.0–33.0)
MCHC: 34 g/dL (ref 32.0–36.0)
MCV: 91 fL (ref 80.0–100.0)
MPV: 10.6 fL (ref 7.5–12.5)
Platelets: 254 10*3/uL (ref 140–400)
RBC: 4.68 10*6/uL (ref 3.80–5.10)
RDW: 12.9 % (ref 11.0–15.0)
WBC: 7.1 10*3/uL (ref 3.8–10.8)

## 2018-10-18 LAB — HEMOGLOBIN A1C
EAG (MMOL/L): 5.5 (calc)
Hgb A1c MFr Bld: 5.1 % of total Hgb (ref <5.7)
MEAN PLASMA GLUCOSE: 100 (calc)

## 2018-10-18 LAB — COMPREHENSIVE METABOLIC PANEL
AG RATIO: 1.6 (calc) (ref 1.0–2.5)
ALT: 28 U/L (ref 6–29)
AST: 20 U/L (ref 10–30)
Albumin: 4.4 g/dL (ref 3.6–5.1)
Alkaline phosphatase (APISO): 93 U/L (ref 33–115)
BILIRUBIN TOTAL: 0.7 mg/dL (ref 0.2–1.2)
BUN: 10 mg/dL (ref 7–25)
CALCIUM: 9.8 mg/dL (ref 8.6–10.2)
CHLORIDE: 105 mmol/L (ref 98–110)
CO2: 29 mmol/L (ref 20–32)
Creat: 0.93 mg/dL (ref 0.50–1.10)
GLOBULIN: 2.8 g/dL (ref 1.9–3.7)
GLUCOSE: 107 mg/dL — AB (ref 65–99)
Potassium: 4.3 mmol/L (ref 3.5–5.3)
Sodium: 143 mmol/L (ref 135–146)
Total Protein: 7.2 g/dL (ref 6.1–8.1)

## 2018-10-18 LAB — LIPID PANEL W/REFLEX DIRECT LDL
Cholesterol: 242 mg/dL — ABNORMAL HIGH (ref <200)
HDL: 47 mg/dL — ABNORMAL LOW (ref >50)
LDL Cholesterol (Calc): 144 mg/dL (calc) — ABNORMAL HIGH
NON-HDL CHOLESTEROL (CALC): 195 mg/dL — AB (ref <130)
TRIGLYCERIDES: 334 mg/dL — AB (ref <150)
Total CHOL/HDL Ratio: 5.1 (calc) — ABNORMAL HIGH (ref <5.0)

## 2018-10-26 ENCOUNTER — Ambulatory Visit (INDEPENDENT_AMBULATORY_CARE_PROVIDER_SITE_OTHER): Payer: Managed Care, Other (non HMO)

## 2018-10-26 DIAGNOSIS — Z1231 Encounter for screening mammogram for malignant neoplasm of breast: Secondary | ICD-10-CM | POA: Diagnosis not present

## 2018-12-26 ENCOUNTER — Ambulatory Visit: Payer: Managed Care, Other (non HMO) | Admitting: Physician Assistant

## 2018-12-26 ENCOUNTER — Ambulatory Visit (INDEPENDENT_AMBULATORY_CARE_PROVIDER_SITE_OTHER): Payer: 59

## 2018-12-26 ENCOUNTER — Encounter: Payer: Self-pay | Admitting: Physician Assistant

## 2018-12-26 VITALS — BP 135/81 | HR 95 | Temp 98.1°F | Wt 278.8 lb

## 2018-12-26 DIAGNOSIS — J22 Unspecified acute lower respiratory infection: Secondary | ICD-10-CM

## 2018-12-26 MED ORDER — HYDROCOD POLST-CPM POLST ER 10-8 MG/5ML PO SUER: 5.0000 mL | Freq: Two times a day (BID) | ORAL | 0 refills | Status: AC | PRN

## 2018-12-26 MED ORDER — AZITHROMYCIN 250 MG PO TABS: ORAL_TABLET | ORAL | 0 refills | Status: DC

## 2018-12-26 MED ORDER — PREDNISONE 50 MG PO TABS: ORAL_TABLET | ORAL | 0 refills | Status: DC

## 2018-12-26 NOTE — Patient Instructions (Signed)
For nasal symptoms/sinusitis: - nasal saline rinses / netti pot several times per day (do this prior to nasal spray) - prescription Atrovent nasal spray: 2 sprays each nostril, up to 4 times per day as needed - you can use OTC nasal decongestant sprays like Afrin (oxymetazoline) BUT do not use for more than 3 days as it will cause worsening congestion/nasal symptoms) - warm facial compresses - oral decongestants and antihistamines like Claritin-D and Zyrtec-D may help dry up secretions (caution using decongestants if you have high blood pressure, heart disease or kidney disease) - for sinus headache: Tylenol 1000mg every 8 hours as needed. Alternate with Ibuprofen 600mg every 6 hours  For sore throat: - Tylenol 1000mg every 8 hours as needed for throat pain. Alternate with Ibuprofen 600mg every 6 hours - Cepacol throat lozenges and/or Chloraseptic spray - Warm salt water gargles  For cough: - Cough is a protective mechanism and an important part of fighting an infection. I encourage you to avoid suppressing your cough with medication during the day if possible.  - Mucinex with at least 8 oz. of water can loosen chest congestion and make cough more productive, which means you will actually cough less - Okay to use a cough suppressant at bedtime in order to rest  Note: follow package instructions for all over-the-counter medications. If using multi-symptom medications (Dayquil, Theraflu, etc.), check the label for duplicate drug ingredients.   Viral Respiratory Infection A respiratory infection is an illness that affects part of the respiratory system, such as the lungs, nose, or throat. A respiratory infection that is caused by a virus is called a viral respiratory infection. Common types of viral respiratory infections include:  A cold.  The flu (influenza).  A respiratory syncytial virus (RSV) infection. What are the causes? This condition is caused by a virus. What are the signs or  symptoms? Symptoms of this condition include:  A stuffy or runny nose.  Yellow or green nasal discharge.  A cough.  Sneezing.  Fatigue.  Achy muscles.  A sore throat.  Sweating or chills.  A fever.  A headache. How is this diagnosed? This condition may be diagnosed based on:  Your symptoms.  A physical exam.  Testing of nasal swabs. How is this treated? This condition may be treated with medicines, such as:  Antiviral medicine. This may shorten the length of time a person has symptoms.  Expectorants. These make it easier to cough up mucus.  Decongestant nasal sprays.  Acetaminophen or NSAIDs to relieve fever and pain. Antibiotic medicines are not prescribed for viral infections. This is because antibiotics are designed to kill bacteria. They are not effective against viruses. Follow these instructions at home:  Managing pain and congestion  Take over-the-counter and prescription medicines only as told by your health care provider.  If you have a sore throat, gargle with a salt-water mixture 3-4 times a day or as needed. To make a salt-water mixture, completely dissolve -1 tsp of salt in 1 cup of warm water.  Use nose drops made from salt water to ease congestion and soften raw skin around your nose.  Drink enough fluid to keep your urine pale yellow. This helps prevent dehydration and helps loosen up mucus. General instructions  Rest as much as possible.  Do not drink alcohol.  Do not use any products that contain nicotine or tobacco, such as cigarettes and e-cigarettes. If you need help quitting, ask your health care provider.  Keep all follow-up visits as   told by your health care provider. This is important. How is this prevented?   Get an annual flu shot. You may get the flu shot in late summer, fall, or winter. Ask your health care provider when you should get your flu shot.  Avoid exposing others to your respiratory infection. ? Stay home from  work or school as told by your health care provider. ? Wash your hands with soap and water often, especially after you cough or sneeze. If soap and water are not available, use alcohol-based hand sanitizer.  Avoid contact with people who are sick during cold and flu season. This is generally fall and winter. Contact a health care provider if:  Your symptoms last for 10 days or longer.  Your symptoms get worse over time.  You have a fever.  You have severe sinus pain in your face or forehead.  The glands in your jaw or neck become very swollen. Get help right away if you:  Feel pain or pressure in your chest.  Have shortness of breath.  Faint or feel like you will faint.  Have severe and persistent vomiting.  Feel confused or disoriented. Summary  A respiratory infection is an illness that affects part of the respiratory system, such as the lungs, nose, or throat. A respiratory infection that is caused by a virus is called a viral respiratory infection.  Common types of viral respiratory infections are a cold, influenza, and respiratory syncytial virus (RSV) infection.  Symptoms of this condition include a stuffy or runny nose, cough, sneezing, fatigue, achy muscles, sore throat, and fevers or chills.  Antibiotic medicines are not prescribed for viral infections. This is because antibiotics are designed to kill bacteria. They are not effective against viruses. This information is not intended to replace advice given to you by your health care provider. Make sure you discuss any questions you have with your health care provider. Document Released: 09/23/2005 Document Revised: 01/24/2018 Document Reviewed: 01/24/2018 Elsevier Interactive Patient Education  2019 [employer]   

## 2018-12-26 NOTE — Progress Notes (Signed)
HPI:                                                                Laurie [name] is a 42 y.o. female who [name] [facility] Medicine today for cough  Cough  This is a new problem. The current episode started in the past 7 days. The problem has been gradually worsening. The problem occurs every few minutes. The cough is productive of purulent sputum. Associated symptoms include chills, headaches, nasal congestion, a sore throat and wheezing. Pertinent negatives include no fever or shortness of breath. Associated symptoms comments: + blood-tinged sputum. The symptoms are aggravated by lying down (worse at night). She has tried rest and OTC cough suppressant (Mucinex D, Tylenol) for the symptoms. The treatment provided no relief.    Past Medical History:  Diagnosis Date  . Endometriosis   . Flying phobia   . 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 History of colon polyps   . History of kidney stones 08/25/2013  . Meningitis    spinal  . Obesity   . Pancreatitis, acute 08/16/2015  . Spinal headache 12/02/2011   Past Surgical History:  Procedure Laterality Date  . ABDOMINAL HYSTERECTOMY    . bilateral tubal      x2  . CHOLECYSTECTOMY    . ECTOPIC PREGNANCY SURGERY    . KNEE ARTHROSCOPY Right   . TONSILLECTOMY    . TUBAL LIGATION     Social History   Tobacco Use  . Smoking status: Former Smoker    Packs/day: 2.00    Years: 20.00    Pack years: 40.00    Types: Cigarettes    Last attempt to quit: 10/18/2007    Years since quitting: 11.1  . Smokeless tobacco: Never Used  Substance Use Topics  . Alcohol use: Yes    Alcohol/week: 5.0 - 7.0 standard drinks    Types: 5 - 7 Standard drinks or equivalent per week    Comment: weekly   family history includes Alcohol abuse in her father; Cancer in her maternal grandfather and paternal grandfather; Colon cancer in her maternal grandfather and paternal grandfather; Diabetes in her father and paternal  grandmother; Hyperlipidemia in her maternal grandfather and maternal grandmother; Hypertension in her maternal grandfather and maternal grandmother; Throat cancer in her maternal grandfather; Thyroid disease in her maternal grandmother and mother.    ROS: negative except as noted in the HPI  Medications: Current Outpatient Medications  Medication Sig Dispense Refill  . ALPRAZolam (XANAX) 0.5 MG tablet One tab PO as needed 10 tablet 0  . lisdexamfetamine (VYVANSE) 60 MG capsule Take 1 capsule (60 mg total) by mouth every morning. 30 capsule 0  . meloxicam (MOBIC) 15 MG tablet Take 1 tablet by mouth daily as needed.    . valACYclovir (VALTREX) 1000 MG tablet Take 1 tablet (1,000 mg total) by mouth 2 (two) times daily. 14 tablet 12  . azithromycin (ZITHROMAX Z-PAK) 250 MG tablet Take 2 tablets (500 mg) on  Day 1,  followed by 1 tablet (250 mg) once daily on Days 2 through 5. 6 tablet 0  . chlorpheniramine-HYDROcodone (TUSSIONEX) 10-8 MG/5ML SUER Take 5 mLs by mouth every 12 (twelve) hours as needed for up to 5 days for  cough. 50 mL 0  . predniSONE (DELTASONE) 50 MG tablet One tab PO daily for 5 days. 5 tablet 0   No current facility-administered medications for this visit.    No Known Allergies     Objective:  BP 135/81 (BP Location: Left Arm, Patient Position: Sitting, Cuff Size: Large)   Pulse 95   Temp 98.1 F (36.7 C) (Oral)   Wt 278 lb 12.8 oz (126.5 kg)   SpO2 99%   BMI 38.88 kg/m  Gen:  alert, not ill-appearing, no distress, appropriate for age HEENT: head normocephalic without obvious abnormality, conjunctiva and cornea clear, right TM with air-fluid level slightly retracted, left TM pearly gray and semitransparent, nasal mucosa pink, no sinus tenderness, oropharynx clear, no cervical adenopathy, neck supple trachea midline Pulm: Normal work of breathing, normal phonation, clear to auscultation bilaterally, no wheezes, rales or rhonchi, poor expiratory effort CV: Normal rate,  regular rhythm, s1 and s2 distinct, no murmurs, clicks or rubs  Neuro: alert and oriented x 3, no tremor MSK: extremities atraumatic, normal gait and station Skin: intact, no rashes on exposed skin, no jaundice, no cyanosis   No results found for this or any previous visit (from the past 72 hour(s)). No results found.    Assessment and Plan: 42 y.o. female with   .Laurie [name] was seen today for cough and nasal congestion.  Diagnoses and all orders for this visit:  Acute lower respiratory infection -     DG Chest 2 View -     predniSONE (DELTASONE) 50 MG tablet; One tab PO daily for 5 days. -     azithromycin (ZITHROMAX Z-PAK) 250 MG tablet; Take 2 tablets (500 mg) on  Day 1,  followed by 1 tablet (250 mg) once daily on Days 2 through 5. -     chlorpheniramine-HYDROcodone (TUSSIONEX) 10-8 MG/5ML SUER; Take 5 mLs by mouth every 12 (twelve) hours as needed for up to 5 days for cough.   Afebrile, no tachypnea, no tachycardia, pulse ox 99% on room air at rest, no adventitious lung sounds, poor effort Given episode of blood-tinged sputum and remote history of pneumonia, will obtain CXR and treat empirically for CAP Did not appreciate any wheezing but will prescribe prednisone Counseled on supportive care  Patient education and anticipatory guidance given Patient agrees with treatment plan Follow-up as needed if symptoms worsen or fail to improve  Levonne Hubertharley E.  PA-C

## 2019-01-16 ENCOUNTER — Encounter: Payer: Self-pay | Admitting: Physician Assistant

## 2019-01-16 ENCOUNTER — Ambulatory Visit (INDEPENDENT_AMBULATORY_CARE_PROVIDER_SITE_OTHER): Payer: 59 | Admitting: Physician Assistant

## 2019-01-16 VITALS — BP 131/87 | HR 105 | Wt 276.0 lb

## 2019-01-16 DIAGNOSIS — T886XXA Anaphylactic reaction due to adverse effect of correct drug or medicament properly administered, initial encounter: Secondary | ICD-10-CM | POA: Diagnosis not present

## 2019-01-16 DIAGNOSIS — B9689 Other specified bacterial agents as the cause of diseases classified elsewhere: Secondary | ICD-10-CM

## 2019-01-16 DIAGNOSIS — F909 Attention-deficit hyperactivity disorder, unspecified type: Secondary | ICD-10-CM | POA: Diagnosis not present

## 2019-01-16 DIAGNOSIS — J309 Allergic rhinitis, unspecified: Secondary | ICD-10-CM

## 2019-01-16 DIAGNOSIS — R000 Tachycardia, unspecified: Secondary | ICD-10-CM | POA: Diagnosis not present

## 2019-01-16 DIAGNOSIS — J019 Acute sinusitis, unspecified: Secondary | ICD-10-CM

## 2019-01-16 MED ORDER — CETIRIZINE HCL 10 MG PO TABS: 10.0000 mg | ORAL_TABLET | Freq: Every day | ORAL | 3 refills | Status: DC

## 2019-01-16 MED ORDER — BUPROPION HCL ER (XL) 150 MG PO TB24: 150.0000 mg | ORAL_TABLET | ORAL | 2 refills | Status: DC

## 2019-01-16 MED ORDER — AMOXICILLIN-POT CLAVULANATE 875-125 MG PO TABS: 1.0000 | ORAL_TABLET | Freq: Two times a day (BID) | ORAL | 0 refills | Status: AC

## 2019-01-16 NOTE — Patient Instructions (Addendum)
Send MyChart message with BP and HR readings Stop Sudafed and any oral decongestants like Pseudephedrine  Attention Deficit Hyperactivity Disorder, Adult Attention deficit hyperactivity disorder (ADHD) is a mental health disorder that starts during childhood. For many people with ADHD, the disorder continues into adult years. There are many things that you and your health care provider or therapist (mental health professional) can do to manage your symptoms. What are the causes? The exact cause of ADHD is not known. What increases the risk? You are more likely to develop this condition if:  You have a family history of ADHD.  You are female.  You were born to a mother who smoked or drank alcohol during pregnancy.  You were exposed to lead poisoning or other toxins in the womb or in early life.  You were born before 37 weeks of pregnancy (prematurely) or you had a low birth weight.  You have experienced a brain injury. What are the signs or symptoms? Symptoms of this condition depend on the type of ADHD. The two main types are inattentive and hyperactive-impulsive. Some people may have symptoms of both types. Symptoms of the inattentive type include:  Difficulty watching, listening, or thinking with focused effort (paying attention).  Making careless mistakes.  Not listening.  Not following instructions.  Being disorganized.  Avoiding tasks that require time and attention.  Losing things.  Forgetting things.  Being easily distracted. Symptoms of the hyperactive-impulsive type include:  Restlessness.  Talking too much.  Interrupting.  Difficulty with: ? Sitting still. ? Staying quiet. ? Feeling motivated. ? Relaxing. ? Waiting in line or waiting for a turn. How is this diagnosed? This condition is diagnosed based on your current symptoms and your history of symptoms. The diagnosis can be made by a provider such as a primary care provider, psychiatrist,  psychologist, or clinical social worker. The provider may use a symptom checklist or a standardized behavior rating scale to evaluate your symptoms. He or she may want to talk with family members who have known you for a long time and have observed your behaviors. There are no lab tests or brain imaging tests that can diagnose ADHD. How is this treated? This condition can be treated with medicines and behavior therapy. Medicines may be the best option to reduce impulsive behaviors and improve attention. Your health care provider may recommend:  Stimulant medicines. These are the most common medicines used for adult ADHD. They affect certain chemicals in the brain (neurotransmitters). These medicines may be long-acting or short-acting. This will determine how often you need to take the medicine.  A non-stimulant medicine for adult ADHD (atomoxetine). This medicine increases a neurotransmitter called norepinephrine. It may take weeks to months to see effects from this medicine. Psychotherapy and behavioral management are also important for treating ADHD. Psychotherapy is often used along with medicine. Your health care provider may suggest:  Cognitive behavioral therapy (CBT). This type of therapy teaches you to replace negative thoughts and actions with positive thoughts and actions. When used as part of ADHD treatment, this therapy may also include: ? Coping strategies for organization, time management, impulse control, and stress reduction. ? Mindfulness and meditation training.  Behavioral management. This may include strategies for organization and time management. You may work with an ADHD coach who is specially trained to help people with ADHD to manage and organize activities and to function more effectively. Follow these instructions at home: Medicines   Take over-the-counter and prescription medicines only as told by your  health care provider.  Talk with your health care provider about  the possible side effects of your medicine to watch for. General instructions   Learn as much as you can about adult ADHD, and work closely with your health care providers to find the treatments that work best for you.  Do not use drugs or abuse alcohol. Limit alcohol intake to no more than 1 drink a day for nonpregnant women and 2 drinks a day for men. One drink equals 12 oz of beer, 5 oz of wine, or 1 oz of hard liquor.  Follow the same schedule each day. Make sure your schedule includes enough time for you to get plenty of sleep.  Use reminder devices like notes, calendars, and phone apps to stay on-time and organized.  Eat a healthy diet. Do not skip meals.  Exercise regularly. Exercise can help to reduce stress and anxiety.  Keep all follow-up visits as told by your health care provider and therapist. This is important. Where to find more information  A health care provider may be able to recommend resources that are available online or over the phone. You could start with: ? Attention Deficit Disorder Association (ADDA): http://davis-dillon.net/ ? [employer] of Mental Health [facility]): http://www.maynard.net/ Contact a health care provider if:  Your symptoms are changing, getting worse, or not improving.  You have side effects from your medicine, such as: ? Repeated muscle twitches, coughing, or speech outbursts. ? Sleep problems. ? Loss of appetite. ? Depression. ? New or worsening behavior problems. ? Dizziness. ? Unusually fast heartbeat. ? Stomach pains. ? Headaches.  You are struggling with anxiety, depression, or substance abuse. Get help right away if:  You have a severe reaction to a medicine.  You have thoughts of hurting yourself or others. If you ever feel like you may hurt yourself or others, or have thoughts about taking your own life, get help right away. You can go to the nearest emergency department or call:  Your local emergency services (911 in the [place]  A  suicide crisis helpline, such as the National Suicide Prevention Lifeline at 248-076-4687. This is open 24 hours a day. Summary  ADHD is a mental health disorder that starts during childhood and often continues into adult years.  The exact cause of ADHD is not known.  There is no cure for ADHD, but treatment with medicine, therapy, or behavioral training can help you manage your condition. This information is not intended to replace advice given to you by your health care provider. Make sure you discuss any questions you have with your health care provider. Document Released: 08/05/2017 Document Revised: 08/05/2017 Document Reviewed: 08/05/2017 Elsevier Interactive Patient Education  2019 [employer].

## 2019-01-16 NOTE — Progress Notes (Signed)
HPI:                                                                Laurie [name] is a 43 y.o. female who presents to [name]: Primary Care Sports Medicine today for 3 month follow-up  HTN: she monitored her BP at home for the last 3 months. Denies vision change, headache, chest pain with exertion, orthopnea, lightheadedness, syncope and edema. Risk factors include: HLD, obesity  ADHD: reports she was tested for ADHD in her late 20's. She has been taking Vyvanse 60 mg daily for over 10 years, but developed lip/facial swelling, itchy rash and feeling of throat closing on several occasions in November. She discontinued Vyvanse about 2 months ago and has not had any additional symptoms since. She has not had any other prior ADHD medications.  She also c/o sinus pressure and dental pain worse on the right side as well as dry cough. Symptoms have been present for approx 3 weeks. She was treated for lower respiratory infection ?pnuemonia on 12/26/18 with Azithromycin and Prednisone. CXR was negative. Denies fever, no additional episodes of blood-tinged sputum. She has been taking Sudafed and Flonase.   Past Medical History:  Diagnosis Date  . Endometriosis   . Flying phobia   . 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 History of colon polyps   . History of kidney stones 08/25/2013  . Meningitis    spinal  . Obesity   . Pancreatitis, acute 08/16/2015  . Spinal headache 12/02/2011   Past Surgical History:  Procedure Laterality Date  . ABDOMINAL HYSTERECTOMY    . bilateral tubal      x2  . CHOLECYSTECTOMY    . ECTOPIC PREGNANCY SURGERY    . KNEE ARTHROSCOPY Right   . TONSILLECTOMY    . TUBAL LIGATION     Social History   Tobacco Use  . Smoking status: Former Smoker    Packs/day: 2.00    Years: 20.00    Pack years: 40.00    Types: Cigarettes    Last attempt to quit: 10/18/2007    Years since quitting: 11.2  . Smokeless tobacco: Never Used  Substance Use Topics  .  Alcohol use: Yes    Alcohol/week: 5.0 - 7.0 standard drinks    Types: 5 - 7 Standard drinks or equivalent per week    Comment: weekly   family history includes Alcohol abuse in her father; Cancer in her maternal grandfather and paternal grandfather; Colon cancer in her maternal grandfather and paternal grandfather; Diabetes in her father and paternal grandmother; Hyperlipidemia in her maternal grandfather and maternal grandmother; Hypertension in her maternal grandfather and maternal grandmother; Throat cancer in her maternal grandfather; Thyroid disease in her maternal grandmother and mother.    ROS: negative except as noted in the HPI  Medications: Current Outpatient Medications  Medication Sig Dispense Refill  . ALPRAZolam (XANAX) 0.5 MG tablet One tab PO as needed 10 tablet 0  . meloxicam (MOBIC) 15 MG tablet Take 1 tablet by mouth daily as needed.    . valACYclovir (VALTREX) 1000 MG tablet Take 1 tablet (1,000 mg total) by mouth 2 (two) times daily. 14 tablet 12  . amoxicillin-clavulanate (AUGMENTIN) 875-125 MG tablet Take 1 tablet by mouth 2 (two) times  daily for 10 days. 20 tablet 0  . buPROPion (WELLBUTRIN XL) 150 MG 24 hr tablet Take 1 tablet (150 mg total) by mouth every morning. 30 tablet 2  . cetirizine (ZYRTEC) 10 MG tablet Take 1 tablet (10 mg total) by mouth at bedtime. 90 tablet 3   No current facility-administered medications for this visit.    Allergies  Allergen Reactions  . Vyvanse [Lisdexamfetamine Dimesylate] Anaphylaxis       Objective:  BP 131/87   Pulse (!) 105   Wt 276 lb (125.2 kg)   SpO2 97%   BMI 38.49 kg/m  Gen:  alert, not ill-appearing, no distress, appropriate for age HEENT: head normocephalic without obvious abnormality, conjunctiva and cornea clear, wearing glasses, TM's pearly gray and semitransparent bilaterally, nasal mucosa pink, right-sided frontal and maxillary sinus tenderness, neck supple, no cervical adenopathy, trachea midline Pulm:  Normal work of breathing, normal phonation, clear to auscultation bilaterally, no wheezes, rales or rhonchi CV: tachycardic rate at 110-114, regular rhythm, s1 and s2 distinct, no murmurs, clicks or rubs  Neuro: alert and oriented x 3, no tremor MSK: extremities atraumatic, normal gait and station Skin: intact, no rashes on exposed skin, no jaundice, no cyanosis Psych: appearance casual, cooperative, good eye contact, euthymic mood, affect mood-congruent, speech is articulate, and thought processes clear and goal-directed  Pulse Readings from Last 3 Encounters:  01/16/19 (!) 105  12/26/18 95  10/17/18 76   BP Readings from Last 3 Encounters:  01/16/19 131/87  12/26/18 135/81  10/17/18 133/85   Lab Results  Component Value Date   CREATININE 0.93 10/17/2018   BUN 10 10/17/2018   NA 143 10/17/2018   K 4.3 10/17/2018   CL 105 10/17/2018   CO2 29 10/17/2018   Lab Results  Component Value Date   CHOL 242 (H) 10/17/2018   HDL 47 (L) 10/17/2018   LDLCALC 144 (H) 10/17/2018   TRIG 334 (H) 10/17/2018   CHOLHDL 5.1 (H) 10/17/2018    The 10-year ASCVD risk score Denman George(Goff DC Jr., et al., 2013) is: 1.3%   Values used to calculate the score:     Age: [age] years     Sex: Female     Is Non-Hispanic African American: No     Diabetic: No     Tobacco smoker: No     Systolic Blood Pressure: 131 mmHg     Is BP treated: No     HDL Cholesterol: 47 mg/dL     Total Cholesterol: 242 mg/dL   No results found for this or any previous visit (from the past 72 hour(s)). No results found.    Assessment and Plan: 43 y.o. female with   .Laurie [name] was seen today for medication management.  Diagnoses and all orders for this visit:  Adult ADHD -     buPROPion (WELLBUTRIN XL) 150 MG 24 hr tablet; Take 1 tablet (150 mg total) by mouth every morning.  Allergic rhinitis, unspecified seasonality, unspecified trigger -     cetirizine (ZYRTEC) 10 MG tablet; Take 1 tablet (10 mg total) by mouth at  bedtime.  Anaphylactic reaction due to adverse effect of correct drug or medicament properly administered, initial encounter -     Cancel: Ambulatory referral to Allergy -     Ambulatory referral to Allergy  Tachycardia with heart rate 100-120 beats per minute  Acute bacterial sinusitis -     amoxicillin-clavulanate (AUGMENTIN) 875-125 MG tablet; Take 1 tablet by mouth 2 (two) times daily for 10 days.  HTN: she will send me her readings over MyChart this week. 10-yr ASCVD risk 1.3% Counseled on therapeutic lifestyle changes  ADHD: due to her tachycardia today and elevated BP, we will avoid stimulant medications. Vyvanse added to allergy list. Start wellbutrin XL 150 mg QAC  Tachycardia: patient admits to drinking coffee this morning and taking Sudafed for sinusitis. Counseled to avoid Sudafed/oral decongestants and hydrate with water. Monitor HR at home and send readings over MyChart  Sinusitis: symptoms present for 3 weeks. Treating empirically with Augmentin. Cont Flonase. Start antihistamine. Avoid oral decongestants.  Patient education and anticipatory guidance given Patient agrees with treatment plan Follow-up as needed if symptoms worsen or fail to improve  Levonne Hubertharley E.  PA-C

## 2019-01-31 ENCOUNTER — Encounter: Payer: Self-pay | Admitting: Pediatrics

## 2019-01-31 ENCOUNTER — Ambulatory Visit: Payer: 59 | Admitting: Pediatrics

## 2019-01-31 VITALS — BP 126/84 | HR 83 | Temp 98.2°F | Resp 20 | Ht 70.0 in | Wt 279.0 lb

## 2019-01-31 DIAGNOSIS — Z872 Personal history of diseases of the skin and subcutaneous tissue: Secondary | ICD-10-CM

## 2019-01-31 DIAGNOSIS — Z6841 Body Mass Index (BMI) 40.0 and over, adult: Secondary | ICD-10-CM

## 2019-01-31 DIAGNOSIS — J3089 Other allergic rhinitis: Secondary | ICD-10-CM | POA: Diagnosis not present

## 2019-01-31 DIAGNOSIS — L500 Allergic urticaria: Secondary | ICD-10-CM | POA: Diagnosis not present

## 2019-01-31 DIAGNOSIS — Z8739 Personal history of other diseases of the musculoskeletal system and connective tissue: Secondary | ICD-10-CM | POA: Diagnosis not present

## 2019-01-31 MED ORDER — CYPROHEPTADINE HCL 4 MG PO TABS: 4.0000 mg | ORAL_TABLET | Freq: Three times a day (TID) | ORAL | 5 refills | Status: DC | PRN

## 2019-01-31 MED ORDER — OLOPATADINE HCL 0.7 % OP SOLN: 1.0000 [drp] | Freq: Every day | OPHTHALMIC | 5 refills | Status: DC

## 2019-01-31 MED ORDER — FAMOTIDINE 20 MG PO TABS: 20.0000 mg | ORAL_TABLET | Freq: Two times a day (BID) | ORAL | 5 refills | Status: DC

## 2019-01-31 NOTE — Progress Notes (Signed)
[address] [place] [id number] Dept: [phone]  New Patient Note  Patient ID: Laurie [name], female    DOB: 12/27/1976  Age: 43 y.o. MRN: [id number] Date of Office Visit: 01/31/2019 Referring provider: Carlis Stableummings, Charley Elizabeth, PA-C [address] [place], [place] [id number]    Chief Complaint: Oral Swelling and Rash  HPI Laurie [name] presents for an allergy evaluation.  She has a history of cold urticaria  since the fifth grade.  From July until November 2019 she had recurrent episodes of hives, lip swelling, irritation of her eyes and shortness of breath.  She was then living in an apartment with a problem with mildew.  Vyvanse was stopped in November.  She has done better but still has some itching at times.  She has a history of allergic rhinitis for several years aggravated by exposure to dust and the springtime of the year.  She has never had asthmatic symptoms or eczema.  She has gastroesophageal reflux and uses omeprazole as needed.  She has obstructive sleep apnea requiring CPAP but her machine broke recently.  She is on Zyrtec 10 mg once a day  Review of Systems  Constitutional: Negative.   HENT:       Sneezing and nasal congestion for several years.  Tonsillectomy .  Obstructive sleep apnea requiring CPAP  Eyes:       Itch at times  Respiratory: Negative.   Cardiovascular:       Hypertension at times  Gastrointestinal:       Heartburn at times .  Cholecystectomy  Genitourinary:       Hysterectomy  Musculoskeletal:       Rheumatoid arthritis diagnosed 1 year ago after 4 years of arthritis.  Arthroscopy in 2019  Skin:       Cold urticaria since the fifth grade.  Urticaria with angioedema from July to November 2019  Neurological:       ADHD  Endo/Heme/Allergies:       No diabetes or thyroid disease  Psychiatric/Behavioral: Negative.     Outpatient Encounter Medications as of 01/31/2019  Medication Sig  . ALPRAZolam (XANAX) 0.5 MG  tablet One tab PO as needed  . buPROPion (WELLBUTRIN XL) 150 MG 24 hr tablet Take 1 tablet (150 mg total) by mouth every morning.  . valACYclovir (VALTREX) 1000 MG tablet Take 1 tablet (1,000 mg total) by mouth 2 (two) times daily.  . cetirizine (ZYRTEC) 10 MG tablet Take 1 tablet (10 mg total) by mouth at bedtime. (Patient not taking: Reported on 01/31/2019)  . cyproheptadine (PERIACTIN) 4 MG tablet Take 1 tablet (4 mg total) by mouth 3 (three) times daily as needed (for hives from cold temperatures).  . famotidine (PEPCID) 20 MG tablet Take 1 tablet (20 mg total) by mouth 2 (two) times daily.  . Olopatadine HCl (PAZEO) 0.7 % SOLN Place 1 drop into both eyes daily. 10 minutes before putting in contact lenses.  . [DISCONTINUED] meloxicam (MOBIC) 15 MG tablet Take 1 tablet by mouth daily as needed.   No facility-administered encounter medications on file as of 01/31/2019.      Drug Allergies:  Allergies  Allergen Reactions  . Vyvanse [Lisdexamfetamine Dimesylate] Anaphylaxis    Family History: Laurie [name] family history includes Alcohol abuse in her father; Allergic rhinitis in her mother; Cancer in her maternal grandfather and paternal grandfather; Colon cancer in her maternal grandfather and paternal grandfather; Diabetes in her father and paternal grandmother; Hyperlipidemia in her maternal  grandfather and maternal grandmother; Hypertension in her maternal grandfather and maternal grandmother; Throat cancer in her maternal grandfather; Thyroid disease in her maternal grandmother and mother..  Family history is positive for hayfever, sinus problems, food allergies.  Family history is negative for asthma, angioedema , eczema, chronic urticaria lupus, chronic bronchitis or emphysema.  Social and environmental.  There is one dog in the home.  She is not exposed to cigarette smoking.  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  Her symptoms are not worse at work.  Physical Exam: BP 126/84   Pulse 83   Temp 98.2 F (36.8 C) (Oral)   Resp 20   Ht 5\' 10"  (1.778 m)   Wt 279 lb (126.6 kg)   SpO2 97%   BMI 40.03 kg/m    Physical Exam Vitals signs reviewed.  Constitutional:      Appearance: Normal appearance. She is obese.  HENT:     Head:     Comments: Eyes normal.  Ears normal.  Nose mild swelling of the nasal turbinates.  Pharynx normal. Neck:     Musculoskeletal: Neck supple.  Cardiovascular:     Comments: S1-S2 normal no murmurs Pulmonary:     Comments: Clear to percussion and auscultation Abdominal:     Palpations: Abdomen is soft.     Tenderness: There is no abdominal tenderness.     Comments: No hepatosplenomegaly  Lymphadenopathy:     Cervical: No cervical adenopathy.  Skin:    Comments: Clear  Neurological:     General: No focal deficit present.     Mental Status: She is alert and oriented to person, place, and time.  Psychiatric:        Mood and Affect: Mood normal.        Behavior: Behavior normal.        Thought Content: Thought content normal.        Judgment: Judgment normal.     Diagnostics: Allergy skin test were positive to dust mites and grass pollen.  Skin testing to foods was negative   Assessment  Assessment and Plan: 1. Other allergic rhinitis   2. Allergic urticaria   3. History of cold urticaria   4. History of rheumatoid arthritis   5. Morbid obesity with BMI of 40.0-44.9, adult (HCC)     Meds ordered this encounter  Medications  . famotidine (PEPCID) 20 MG tablet    Sig: Take 1 tablet (20 mg total) by mouth 2 (two) times daily.    Dispense:  60 tablet    Refill:  5  . Olopatadine HCl (PAZEO) 0.7 % SOLN    Sig: Place 1 drop into both eyes daily. 10 minutes before putting in contact lenses.    Dispense:  1 Bottle    Refill:  5  . cyproheptadine (PERIACTIN) 4 MG tablet    Sig: Take 1 tablet (4 mg total) by mouth 3  (three) times daily as needed (for hives from cold temperatures).    Dispense:  90 tablet    Refill:  5    Patient Instructions  Environmental control of dust mite Zyrtec 10 mg-take 1 tablet twice a day for itching or runny nose Pepcid 20 mg-take 1 tablet twice a day for hives Fluticasone 2 sprays per nostril once a day if needed for stuffy nose Pazeo 0.7% -use 1 drop in each eye at least 10  minutes before putting on contact lenses If you have hives from cold temperatures, you could add cyproheptadine 4 mg - 1 tablet 3 times a day if needed Do foods with salicylates make you itch? Urticaria from cold weather is more common in patients with rheumatic diseases Continue avoiding Vyvanse   Return in about 4 weeks (around 02/28/2019).   Thank you for the opportunity to care for this patient.  Please do not hesitate to contact me with questions.  Tonette Bihari, M.D.  [facility] [address] [place], [place] [address] [phone]

## 2019-01-31 NOTE — Patient Instructions (Addendum)
Environmental control of dust mite Zyrtec 10 mg-take 1 tablet twice a day for itching or runny nose Pepcid 20 mg-take 1 tablet twice a day for hives Fluticasone 2 sprays per nostril once a day if needed for stuffy nose Pazeo 0.7% -use 1 drop in each eye at least 10 minutes before putting on contact lenses If you have hives from cold temperatures, you could add cyproheptadine 4 mg - 1 tablet 3 times a day if needed Do foods with salicylates make you itch? Urticaria from cold weather is more common in patients with rheumatic diseases Continue avoiding Vyvanse

## 2019-02-28 ENCOUNTER — Ambulatory Visit: Payer: 59 | Admitting: Family Medicine

## 2019-02-28 DIAGNOSIS — J309 Allergic rhinitis, unspecified: Secondary | ICD-10-CM

## 2019-03-15 ENCOUNTER — Other Ambulatory Visit: Payer: Self-pay

## 2019-03-15 ENCOUNTER — Encounter: Payer: Self-pay | Admitting: Physician Assistant

## 2019-03-15 ENCOUNTER — Ambulatory Visit: Payer: 59 | Admitting: Physician Assistant

## 2019-03-15 VITALS — BP 136/94 | HR 92 | Temp 97.6°F | Wt 276.0 lb

## 2019-03-15 DIAGNOSIS — R6889 Other general symptoms and signs: Secondary | ICD-10-CM

## 2019-03-15 LAB — POCT INFLUENZA A/B
INFLUENZA A, POC: NEGATIVE
Influenza B, POC: NEGATIVE

## 2019-03-15 MED ORDER — OSELTAMIVIR PHOSPHATE 75 MG PO CAPS: 75.0000 mg | ORAL_CAPSULE | Freq: Two times a day (BID) | ORAL | 0 refills | Status: AC

## 2019-03-15 NOTE — Progress Notes (Signed)
HPI:                                                                Laurie [name] is a 43 y.o. female who presents to [name]: [facility] today for fever   Patient reports 2 days of fever up to 100, myalgias, malaise, headache, sore throat, nasal congestion with PND. Denies cough/SOB. No known sick contacts. Patient reports recent travel to [place].   Past Medical History:  Diagnosis Date  . Endometriosis   . Flying phobia   . 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 History of colon polyps   . History of kidney stones 08/25/2013  . Meningitis    spinal  . Obesity   . Pancreatitis, acute 08/16/2015  . Spinal headache 12/02/2011   Past Surgical History:  Procedure Laterality Date  . ABDOMINAL HYSTERECTOMY    . bilateral tubal      x2  . CHOLECYSTECTOMY    . ECTOPIC PREGNANCY SURGERY    . KNEE ARTHROSCOPY Right   . TONSILLECTOMY    . TUBAL LIGATION     Social History   Tobacco Use  . Smoking status: Former Smoker    Packs/day: 2.00    Years: 20.00    Pack years: 40.00    Types: Cigarettes    Last attempt to quit: 10/18/2007    Years since quitting: 11.4  . Smokeless tobacco: Never Used  Substance Use Topics  . Alcohol use: Yes    Alcohol/week: 5.0 - 7.0 standard drinks    Types: 5 - 7 Standard drinks or equivalent per week    Comment: weekly   family history includes Alcohol abuse in her father; Allergic rhinitis in her mother; Cancer in her maternal grandfather and paternal grandfather; Colon cancer in her maternal grandfather and paternal grandfather; Diabetes in her father and paternal grandmother; Hyperlipidemia in her maternal grandfather and maternal grandmother; Hypertension in her maternal grandfather and maternal grandmother; Throat cancer in her maternal grandfather; Thyroid disease in her maternal grandmother and mother.    ROS: negative except as noted in the HPI  Medications: Current Outpatient Medications   Medication Sig Dispense Refill  . ALPRAZolam (XANAX) 0.5 MG tablet One tab PO as needed 10 tablet 0  . buPROPion (WELLBUTRIN XL) 150 MG 24 hr tablet Take 1 tablet (150 mg total) by mouth every morning. 30 tablet 2  . cetirizine (ZYRTEC) 10 MG tablet Take 1 tablet (10 mg total) by mouth at bedtime. (Patient not taking: Reported on 01/31/2019) 90 tablet 3  . cyproheptadine (PERIACTIN) 4 MG tablet Take 1 tablet (4 mg total) by mouth 3 (three) times daily as needed (for hives from cold temperatures). 90 tablet 5  . famotidine (PEPCID) 20 MG tablet Take 1 tablet (20 mg total) by mouth 2 (two) times daily. 60 tablet 5  . Olopatadine HCl (PAZEO) 0.7 % SOLN Place 1 drop into both eyes daily. 10 minutes before putting in contact lenses. 1 Bottle 5  . valACYclovir (VALTREX) 1000 MG tablet Take 1 tablet (1,000 mg total) by mouth 2 (two) times daily. 14 tablet 12   No current facility-administered medications for this visit.    Allergies  Allergen Reactions  . Vyvanse [Lisdexamfetamine Dimesylate] Anaphylaxis  Objective:  BP (!) 136/94   Pulse 92   Temp 97.6 F (36.4 C) (Oral)   Wt 276 lb (125.2 kg)   SpO2 100%   BMI 39.60 kg/m  Gen:  alert, ill-appearing, not toxic-appearing, no acute distress, appropriate for age HEENT: head normocephalic without obvious abnormality, conjunctiva and cornea clear, TM's pearly gray and semitransparent bilaterally, oropharynx with mild redness, no edema or exudates, uvula midline, no cervical adenopathy, neck supple, trachea midline Pulm: Normal work of breathing, normal phonation, clear to auscultation bilaterally, no wheezes, rales or rhonchi CV: Normal rate, regular rhythm, s1 and s2 distinct, no murmurs, clicks or rubs  Neuro: alert and oriented x 3, no tremor MSK: extremities atraumatic, normal gait and station Skin: intact, no rashes on exposed skin, no jaundice, no cyanosis   No results found for this or any previous visit (from the past 72  hour(s)). No results found.    Assessment and Plan: 43 y.o. female with   .Laurie [name] was seen today for generalized body aches.  Diagnoses and all orders for this visit:  Flu-like symptoms -     POCT Influenza A/B -     oseltamivir (TAMIFLU) 75 MG capsule; Take 1 capsule (75 mg total) by mouth 2 (two) times daily for 5 days.   Rapid influenza testing negative She is within the window for Tamiflu and clinically has signs and symptoms consistent with influenza, will treat empirically with Tamiflu twice daily for 5 days She was written out of work for the remainder of the week Work note provided She does not meet current testing criteria for COVID-19; no known contacts and no lower respiratory symptoms  Patient education and anticipatory guidance given Patient agrees with treatment plan Follow-up as needed if symptoms worsen or fail to improve  Levonne Hubert PA-C

## 2019-03-15 NOTE — Patient Instructions (Signed)

## 2019-03-26 ENCOUNTER — Encounter: Payer: Self-pay | Admitting: Physician Assistant

## 2019-04-17 ENCOUNTER — Ambulatory Visit (INDEPENDENT_AMBULATORY_CARE_PROVIDER_SITE_OTHER): Payer: 59 | Admitting: Physician Assistant

## 2019-04-17 ENCOUNTER — Encounter: Payer: Self-pay | Admitting: Physician Assistant

## 2019-04-17 VITALS — BP 124/95 | HR 89 | Temp 98.9°F | Wt 269.0 lb

## 2019-04-17 DIAGNOSIS — Z800 Family history of malignant neoplasm of digestive organs: Secondary | ICD-10-CM | POA: Insufficient documentation

## 2019-04-17 DIAGNOSIS — I10 Essential (primary) hypertension: Secondary | ICD-10-CM

## 2019-04-17 DIAGNOSIS — Z86010 Personal history of colonic polyps: Secondary | ICD-10-CM

## 2019-04-17 DIAGNOSIS — F909 Attention-deficit hyperactivity disorder, unspecified type: Secondary | ICD-10-CM | POA: Diagnosis not present

## 2019-04-17 DIAGNOSIS — E8881 Metabolic syndrome: Secondary | ICD-10-CM

## 2019-04-17 MED ORDER — AMLODIPINE BESYLATE 5 MG PO TABS: 5.0000 mg | ORAL_TABLET | Freq: Every day | ORAL | 2 refills | Status: DC

## 2019-04-17 MED ORDER — LISDEXAMFETAMINE DIMESYLATE 40 MG PO CAPS: 40.0000 mg | ORAL_CAPSULE | ORAL | 0 refills | Status: DC

## 2019-04-17 NOTE — Progress Notes (Signed)
Virtual Visit via Telephone Note  I connected with Laurie [name] on 04/17/19 at  8:30 AM EDT by telephone and verified that I am speaking with the correct person using two identifiers.   I discussed the limitations, risks, security and privacy concerns of performing an evaluation and management service by telephone and the availability of in person appointments. I also discussed with the patient that there may be a patient responsible charge related to this service. The patient expressed understanding and agreed to proceed.   History of Present Illness: HPI:                                                                Laurie [name] is a 43 y.o. female   CC: ADD, medication refills  HTN: currently managed with diet alone. Denies vision change, headache, chest pain with exertion, orthopnea, lightheadedness, syncope and edema. Risk factors include: HLD, obesity  ADHD: reports she was tested for ADHD in her late 20's. She was taking Vyvanse 60 mg daily for over 10 years, but developed lip/facial swelling, itchy rash and feeling of throat closing on several occasions in November 2019. She discontinued Vyvanse in January and symptoms improved, but she was still having intermittent itching. She was referred to an allergist and diagnosed with environmental allergies. She attributed this to living in a rental property full of mold. She has since moved. She was started on Wellbutrin XL 150 mg in place of Vyvanse but she discontinued this after about 6 weeks because she states "she didn't feel like me." She states her husband also noticed that she wa snot herself. She has been without medication for the last 1.5 months and states this is affecting her productivity at work and at home. She would like to re-start Vyvanse.  Depression screen [facility] 04/17/2019 12/26/2018 10/17/2018  Decreased Interest 0 1 0  Down, Depressed, Hopeless 0 0 0  PHQ - 2 Score 0 1 0  Altered sleeping 1 1 -  Tired,  decreased energy 0 1 -  Change in appetite 0 1 -  Feeling bad or failure about yourself  0 0 -  Trouble concentrating 3 0 -  Moving slowly or fidgety/restless 0 0 -  Suicidal thoughts 0 0 -  PHQ-9 Score 4 4 -  Difficult doing work/chores - Not difficult at all -    GAD 7 : Generalized Anxiety Score 04/17/2019 12/26/2018  Nervous, Anxious, on Edge 1 0  Control/stop worrying 1 0  Worry too much - different things 0 0  Trouble relaxing 1 0  Restless 0 0  Easily annoyed or irritable 1 0  Afraid - awful might happen 0 0  Total GAD 7 Score 4 0  Anxiety Difficulty - Not difficult at all      Past Medical History:  Diagnosis Date  . Endometriosis   . Flying phobia   . 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 History of colon polyps   . History of kidney stones 08/25/2013  . Meningitis    spinal  . Obesity   . Pancreatitis, acute 08/16/2015  . Spinal headache 12/02/2011   Past Surgical History:  Procedure Laterality Date  . ABDOMINAL HYSTERECTOMY    . bilateral tubal      x2  .  CHOLECYSTECTOMY    . ECTOPIC PREGNANCY SURGERY    . KNEE ARTHROSCOPY Right   . TONSILLECTOMY    . TUBAL LIGATION     Social History   Tobacco Use  . Smoking status: Former Smoker    Packs/day: 2.00    Years: 20.00    Pack years: 40.00    Types: Cigarettes    Last attempt to quit: 10/18/2007    Years since quitting: 11.5  . Smokeless tobacco: Never Used  Substance Use Topics  . Alcohol use: Yes    Alcohol/week: 5.0 - 7.0 standard drinks    Types: 5 - 7 Standard drinks or equivalent per week    Comment: weekly   family history includes Alcohol abuse in her father; Allergic rhinitis in her mother; Cancer in her maternal grandfather and paternal grandfather; Colon cancer in her maternal grandfather and paternal grandfather; Diabetes in her father and paternal grandmother; Hyperlipidemia in her maternal grandfather and maternal grandmother; Hypertension in her maternal grandfather and maternal grandmother;  Throat cancer in her maternal grandfather; Thyroid disease in her maternal grandmother and mother.    ROS: negative except as noted in the HPI  Medications: Current Outpatient Medications  Medication Sig Dispense Refill  . ALPRAZolam (XANAX) 0.5 MG tablet One tab PO as needed 10 tablet 0  . valACYclovir (VALTREX) 1000 MG tablet Take 1 tablet (1,000 mg total) by mouth 2 (two) times daily. 14 tablet 12  . buPROPion (WELLBUTRIN XL) 150 MG 24 hr tablet Take 1 tablet (150 mg total) by mouth every morning. (Patient not taking: Reported on 04/17/2019) 30 tablet 2  . cetirizine (ZYRTEC) 10 MG tablet Take 1 tablet (10 mg total) by mouth at bedtime. (Patient not taking: Reported on 01/31/2019) 90 tablet 3  . cyproheptadine (PERIACTIN) 4 MG tablet Take 1 tablet (4 mg total) by mouth 3 (three) times daily as needed (for hives from cold temperatures). (Patient not taking: Reported on 04/17/2019) 90 tablet 5  . famotidine (PEPCID) 20 MG tablet Take 1 tablet (20 mg total) by mouth 2 (two) times daily. (Patient not taking: Reported on 04/17/2019) 60 tablet 5  . Olopatadine HCl (PAZEO) 0.7 % SOLN Place 1 drop into both eyes daily. 10 minutes before putting in contact lenses. (Patient not taking: Reported on 04/17/2019) 1 Bottle 5   No current facility-administered medications for this visit.    Allergies  Allergen Reactions  . Vyvanse [Lisdexamfetamine Dimesylate] Anaphylaxis       Objective:  BP (!) 124/95   Pulse 89   Temp 98.9 F (37.2 C) (Oral)   Wt 269 lb (122 kg)   BMI 38.60 kg/m  Pulm: Normal work of breathing, normal phonation, speaking in full sentences Neuro: alert and oriented x 3 Psych: cooperative, euthymic mood, affect mood-congruent, speech is articulate, normal rate and volume; thought processes clear and goal-directed, normal judgment, good insight   BP Readings from Last 3 Encounters:  04/17/19 (!) 124/95  03/15/19 (!) 136/94  01/31/19 126/84   Wt Readings from Last 3  Encounters:  04/17/19 269 lb (122 kg)  03/15/19 276 lb (125.2 kg)  01/31/19 279 lb (126.6 kg)    Lab Results  Component Value Date   CREATININE 0.93 10/17/2018   BUN 10 10/17/2018   NA 143 10/17/2018   K 4.3 10/17/2018   CL 105 10/17/2018   CO2 29 10/17/2018   Lab Results  Component Value Date   ALT 28 10/17/2018   AST 20 10/17/2018   ALKPHOS 109  12/18/2015   BILITOT 0.7 10/17/2018   Lab Results  Component Value Date   HGBA1C 5.1 10/17/2018    The 10-year ASCVD risk score Denman George DC Jr., et al., 2013) is: 1.1%   Values used to calculate the score:     Age: 31 years     Sex: Female     Is Non-Hispanic African American: No     Diabetic: No     Tobacco smoker: No     Systolic Blood Pressure: 124 mmHg     Is BP treated: No     HDL Cholesterol: 47 mg/dL     Total Cholesterol: 242 mg/dL    Assessment and Plan: 43 y.o. female with   .Diagnoses and all orders for this visit:  Hypertension goal BP (blood pressure) < 130/80 -     amLODipine (NORVASC) 5 MG tablet; Take 1 tablet (5 mg total) by mouth daily. -     COMPLETE METABOLIC PANEL WITH GFR  Adult ADHD -     lisdexamfetamine (VYVANSE) 40 MG capsule; Take 1 capsule (40 mg total) by mouth every morning.  History of colon polyps -     Ambulatory referral to Gastroenterology  Family history of colorectal cancer -     Ambulatory referral to Gastroenterology  Insulin resistance -     Hemoglobin A1c   HTN - BP out of range last 3 readings, including home reading today. 10 yr ASCVD risk 1.1%. Since patient would like to resume stimulant medication for ADHD, will start antihypertensive medication. Start Amlodipine 5 mg QAM. Monitor and log BP at home 3-4 days per week. Check CMP in 1 month.  ADHD - did not tolerate Wellbutrin. After completing allergy testing, does not feel Vyvanse was the cause of angioedema symptoms. She would like to re-start. Previously on 60 mg, re-starting at 40 mg due to uncontrolled HTN.  Counseled to self-monitor for signs of allergic reaction, d/c use and contact office.  Hx of colon polyps/Fam hx of colon cancer - no first degree relatives for colon cancer, personal hx of polyps, previously followed by Dr. Micheline Rough (record requested), states she is due for 5 year screening but would like a new referral in [place]  Follow-up in 1 month   Follow Up Instructions:    I discussed the assessment and treatment plan with the patient. The patient was provided an opportunity to ask questions and all were answered. The patient agreed with the plan and demonstrated an understanding of the instructions.   The patient was advised to call back or seek an in-person evaluation if the symptoms worsen or if the condition fails to improve as anticipated.  I provided 11-20 minutes of non-face-to-face time during this encounter.   Carlis Stable, [place]

## 2019-04-18 ENCOUNTER — Encounter: Payer: Self-pay | Admitting: Physician Assistant

## 2019-04-18 DIAGNOSIS — I10 Essential (primary) hypertension: Secondary | ICD-10-CM

## 2019-04-24 MED ORDER — AMLODIPINE BESYLATE 10 MG PO TABS: 10.0000 mg | ORAL_TABLET | Freq: Every day | ORAL | 0 refills | Status: DC

## 2019-05-08 ENCOUNTER — Encounter: Payer: Self-pay | Admitting: Physician Assistant

## 2019-05-08 ENCOUNTER — Ambulatory Visit (INDEPENDENT_AMBULATORY_CARE_PROVIDER_SITE_OTHER): Payer: 59 | Admitting: Physician Assistant

## 2019-05-08 VITALS — BP 115/91 | HR 83 | Wt 268.0 lb

## 2019-05-08 DIAGNOSIS — I10 Essential (primary) hypertension: Secondary | ICD-10-CM

## 2019-05-08 MED ORDER — VALSARTAN 80 MG PO TABS: 80.0000 mg | ORAL_TABLET | Freq: Every day | ORAL | 0 refills | Status: DC

## 2019-05-08 NOTE — Progress Notes (Signed)
Virtual Visit via Telephone Note  I connected with Laurie [name] on 05/08/19 at  8:10 AM EDT by telephone and verified that I am speaking with the correct person using two identifiers.   I discussed the limitations, risks, security and privacy concerns of performing an evaluation and management service by telephone and the availability of in person appointments. I also discussed with the patient that there may be a patient responsible charge related to this service. The patient expressed understanding and agreed to proceed.   History of Present Illness: HPI:                                                                Laurie [name] is a 43 y.o. female   CC: HTN  HTN: Laurie CanavanKrystal was started on Amlodipine approx. 3 weeks ago. She was titrated to 10 mg based on home readings. Compliant with medications. Checks BP's at home. BP range, systolic 115-125, diastolic 85-95. Denies vision change, headache, chest pain with exertion, orthopnea, lightheadedness, syncope and edema. Risk factors include: HLD, obesity   ADD: she has not started Vyvanse due to high blood pressure readings.  Past Medical History:  Diagnosis Date  . Endometriosis   . Flying phobia   . 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 History of colon polyps   . History of kidney stones 08/25/2013  . Meningitis    spinal  . Obesity   . Pancreatitis, acute 08/16/2015  . Spinal headache 12/02/2011   Past Surgical History:  Procedure Laterality Date  . ABDOMINAL HYSTERECTOMY    . bilateral tubal      x2  . CHOLECYSTECTOMY    . ECTOPIC PREGNANCY SURGERY    . KNEE ARTHROSCOPY Right   . TONSILLECTOMY    . TUBAL LIGATION     Social History   Tobacco Use  . Smoking status: Former Smoker    Packs/day: 2.00    Years: 20.00    Pack years: 40.00    Types: Cigarettes    Last attempt to quit: 10/18/2007    Years since quitting: 11.5  . Smokeless tobacco: Never Used  Substance Use Topics  . Alcohol use: Yes    Alcohol/week: 5.0 -  7.0 standard drinks    Types: 5 - 7 Standard drinks or equivalent per week    Comment: weekly   family history includes Alcohol abuse in her father; Allergic rhinitis in her mother; Cancer in her maternal grandfather and paternal grandfather; Colon cancer in her maternal grandfather and paternal grandfather; Diabetes in her father and paternal grandmother; Hyperlipidemia in her maternal grandfather and maternal grandmother; Hypertension in her maternal grandfather and maternal grandmother; Throat cancer in her maternal grandfather; Thyroid disease in her maternal grandmother and mother.    ROS: negative except as noted in the HPI  Medications: Current Outpatient Medications  Medication Sig Dispense Refill  . amLODipine (NORVASC) 10 MG tablet Take 1 tablet (10 mg total) by mouth daily. 30 tablet 0  . cetirizine (ZYRTEC) 10 MG tablet Take 1 tablet (10 mg total) by mouth at bedtime. (Patient not taking: Reported on 05/08/2019) 90 tablet 3  . cyproheptadine (PERIACTIN) 4 MG tablet Take 1 tablet (4 mg total) by mouth 3 (three) times daily as needed (for hives from cold temperatures). (Patient  not taking: Reported on 05/08/2019) 90 tablet 5  . famotidine (PEPCID) 20 MG tablet Take 1 tablet (20 mg total) by mouth 2 (two) times daily. (Patient not taking: Reported on 05/08/2019) 60 tablet 5  . lisdexamfetamine (VYVANSE) 40 MG capsule Take 1 capsule (40 mg total) by mouth every morning. (Patient not taking: Reported on 05/08/2019) 30 capsule 0  . Olopatadine HCl (PAZEO) 0.7 % SOLN Place 1 drop into both eyes daily. 10 minutes before putting in contact lenses. (Patient not taking: Reported on 05/08/2019) 1 Bottle 5  . valACYclovir (VALTREX) 1000 MG tablet Take 1 tablet (1,000 mg total) by mouth 2 (two) times daily. (Patient not taking: Reported on 05/08/2019) 14 tablet 12  . valsartan (DIOVAN) 80 MG tablet Take 1 tablet (80 mg total) by mouth daily. 90 tablet 0   No current facility-administered medications  for this visit.    Allergies  Allergen Reactions  . Vyvanse [Lisdexamfetamine Dimesylate] Other (See Comments)    ?allergic reaction  . Wellbutrin [Bupropion] Other (See Comments)    "did not feel like myself"       Objective:  BP (!) 115/91   Pulse 83   Wt 268 lb (121.6 kg)   BMI 38.45 kg/m    BP Readings from Last 3 Encounters:  05/08/19 (!) 115/91  04/17/19 (!) 124/95  03/15/19 (!) 136/94   Pulse Readings from Last 3 Encounters:  05/08/19 83  04/17/19 89  03/15/19 92   Lab Results  Component Value Date   CREATININE 0.93 10/17/2018   BUN 10 10/17/2018   NA 143 10/17/2018   K 4.3 10/17/2018   CL 105 10/17/2018   CO2 29 10/17/2018   Lab Results  Component Value Date   CHOL 242 (H) 10/17/2018   HDL 47 (L) 10/17/2018   LDLCALC 144 (H) 10/17/2018   TRIG 334 (H) 10/17/2018   CHOLHDL 5.1 (H) 10/17/2018   The 10-year ASCVD risk score Denman George DC Jr., et al., 2013) is: 1.3%   Values used to calculate the score:     Age: 65 years     Sex: Female     Is Non-Hispanic African American: No     Diabetic: No     Tobacco smoker: No     Systolic Blood Pressure: 115 mmHg     Is BP treated: Yes     HDL Cholesterol: 47 mg/dL     Total Cholesterol: 242 mg/dL   Assessment and Plan: 43 y.o. female with   .Laurie [name] was seen today for medication management.  Diagnoses and all orders for this visit:  Hypertension goal BP (blood pressure) < 130/80 -     valsartan (DIOVAN) 80 MG tablet; Take 1 tablet (80 mg total) by mouth daily.   Systolic BP is in range, however diastolic BP is still in stage 1 hypertensive range Adding Valsartan 80 mg Cont Amlodipine 10 mg Recheck renal fx in 2 weeks Cont to monitor and log BP's at home and send readings via MyChart in 5 days    Follow Up Instructions:    I discussed the assessment and treatment plan with the patient. The patient was provided an opportunity to ask questions and all were answered. The patient agreed with the plan  and demonstrated an understanding of the instructions.   The patient was advised to call back or seek an in-person evaluation if the symptoms worsen or if the condition fails to improve as anticipated.  I provided 11-20 minutes of non-face-to-face time during this  encounter.   Trixie Dredge, [place]

## 2019-05-10 ENCOUNTER — Other Ambulatory Visit: Payer: Self-pay | Admitting: Physician Assistant

## 2019-05-10 DIAGNOSIS — I10 Essential (primary) hypertension: Secondary | ICD-10-CM

## 2019-05-10 MED ORDER — VALSARTAN 80 MG PO TABS: 80.0000 mg | ORAL_TABLET | Freq: Every day | ORAL | 0 refills | Status: DC

## 2019-05-24 ENCOUNTER — Other Ambulatory Visit: Payer: Self-pay | Admitting: Physician Assistant

## 2019-05-24 DIAGNOSIS — I10 Essential (primary) hypertension: Secondary | ICD-10-CM

## 2019-05-26 LAB — HM COLONOSCOPY

## 2019-06-16 ENCOUNTER — Encounter: Payer: Self-pay | Admitting: Physician Assistant

## 2019-06-22 ENCOUNTER — Other Ambulatory Visit: Payer: Self-pay | Admitting: Physician Assistant

## 2019-06-22 ENCOUNTER — Encounter: Payer: Self-pay | Admitting: Physician Assistant

## 2019-06-22 DIAGNOSIS — I10 Essential (primary) hypertension: Secondary | ICD-10-CM

## 2019-11-13 ENCOUNTER — Other Ambulatory Visit: Payer: Self-pay

## 2019-11-13 ENCOUNTER — Ambulatory Visit (INDEPENDENT_AMBULATORY_CARE_PROVIDER_SITE_OTHER): Payer: 59 | Admitting: Physician Assistant

## 2019-11-13 VITALS — BP 124/86 | HR 78 | Temp 98.8°F | Ht 71.0 in | Wt 265.0 lb

## 2019-11-13 DIAGNOSIS — R05 Cough: Secondary | ICD-10-CM

## 2019-11-13 DIAGNOSIS — R0981 Nasal congestion: Secondary | ICD-10-CM

## 2019-11-13 DIAGNOSIS — R52 Pain, unspecified: Secondary | ICD-10-CM

## 2019-11-13 DIAGNOSIS — H9201 Otalgia, right ear: Secondary | ICD-10-CM | POA: Diagnosis not present

## 2019-11-13 DIAGNOSIS — J029 Acute pharyngitis, unspecified: Secondary | ICD-10-CM

## 2019-11-13 DIAGNOSIS — R059 Cough, unspecified: Secondary | ICD-10-CM

## 2019-11-13 MED ORDER — IPRATROPIUM BROMIDE 0.06 % NA SOLN: 2.0000 | Freq: Four times a day (QID) | NASAL | 1 refills | Status: DC

## 2019-11-13 NOTE — Progress Notes (Signed)
Symptoms started yesterday:  Cough Sore throat Body aches Pain when breathing in Right ear ache Redness in throat  Has tried Alka- Seltzer flu and cold - not helping

## 2019-11-13 NOTE — Progress Notes (Signed)
Patient ID: Laurie [name], female   DOB: 05-Apr-1976, 43 y.o.   MRN: [id number] .Marland KitchenVirtual Visit via Video Note  I connected with Amere Iott Duby on 11/14/19 at  9:50 AM EST by a video enabled telemedicine application and verified that I am speaking with the correct person using two identifiers.  Location: Patient: home Provider: clinic   I discussed the limitations of evaluation and management by telemedicine and the availability of in person appointments. The patient expressed understanding and agreed to proceed.  History of Present Illness: Pt is a 43 yo female who presents to the clinic with cough, ST, body aches, right ear pain that started yesterday. No other sick contacts and no known covid exposure. Denies any fever, loss of smell or taste, GI symptoms. She is taking alka seltzer cold and flu and not helping. Denies any SOB. She does have some sinus pressure and nasal congestion.   .. Active Ambulatory Problems    Diagnosis Date Noted  . Chronic constipation 08/04/2013  . History of kidney stones 08/25/2013  . Obesity 11/09/2013  . Insulin resistance 11/09/2013  . OSA (obstructive sleep apnea) 02/02/2014  . Breast lump 05/29/2014  . Poor concentration 10/30/2014  . Primary osteoarthritis of both knees 03/11/2015  . History of colon polyps   . Flying phobia   . Adult ADHD 10/17/2018  . History of hysterectomy for benign disease 10/17/2018  . Elevated blood pressure reading 10/17/2018  . Right knee pain 10/17/2018  . Back pain 05/13/2017  . 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 Mixed hyperlipidemia 10/18/2018  . Anaphylactic reaction due to adverse effect of correct drug or medicament properly administered, initial encounter 01/16/2019  . Tachycardia with heart rate 100-120 beats per minute 01/16/2019  . History of cold urticaria 01/31/2019  . Allergic urticaria 01/31/2019  . Other allergic rhinitis 01/31/2019  . History of rheumatoid arthritis 01/31/2019  . Morbid obesity with  BMI of 40.0-44.9, adult (HCC) 01/31/2019  . Hypertension goal BP (blood pressure) < 130/80 04/17/2019  . Family history of colorectal cancer 04/17/2019   Resolved Ambulatory Problems    Diagnosis Date Noted  . Spinal headache 12/02/2011  . Pancreatitis, acute 08/16/2015  . Viral sinusitis 10/15/2015  . History of colonic polyps 12/18/2015   Past Medical History:  Diagnosis Date  . Endometriosis   . Meningitis    Reviewed med, allergy, problem list.    Observations/Objective: No acute distress. Normal appearance.  Flushed cheeks. Dry cough on video. No wheezing or labored breathing.   .. Today's Vitals   11/13/19 0922  BP: 124/86  Pulse: 78  Temp: 98.8 F (37.1 C)  TempSrc: Oral  Weight: 265 lb (120.2 kg)  Height: 5\' 11"  (1.803 m)   Body mass index is 36.96 kg/m.    Assessment and Plan: Marland KitchenRenia was seen today for sore throat.  Diagnoses and all orders for this visit:  Cough  Sore throat  Body aches  Right ear pain -     ipratropium (ATROVENT) 0.06 % nasal spray; Place 2 sprays into both nostrils 4 (four) times daily.  Nasal congestion -     ipratropium (ATROVENT) 0.06 % nasal spray; Place 2 sprays into both nostrils 4 (four) times daily.   \likely viral cold no signature covid symptoms; however, patient needs to be tested for confirmation. Note given to self isolate until results are known for COVID. Use ibuprofen, mucinex, rest, hydration for symptoms. atrovent nasal spray was given. If any worsening SOB consider ED or call office.  Follow Up Instructions:    I discussed the assessment and treatment plan with the patient. The patient was provided an opportunity to ask questions and all were answered. The patient agreed with the plan and demonstrated an understanding of the instructions.   The patient was advised to call back or seek an in-person evaluation if the symptoms worsen or if the condition fails to improve as anticipated.   Iran Planas, PA-C

## 2019-11-14 ENCOUNTER — Encounter: Payer: Self-pay | Admitting: Physician Assistant

## 2020-01-01 ENCOUNTER — Other Ambulatory Visit: Payer: Self-pay

## 2020-01-01 ENCOUNTER — Emergency Department
Admission: EM | Admit: 2020-01-01 | Discharge: 2020-01-01 | Disposition: A | Discharge status: Home / Self Care | Payer: 59 | Source: Home / Self Care

## 2020-01-01 ENCOUNTER — Emergency Department (INDEPENDENT_AMBULATORY_CARE_PROVIDER_SITE_OTHER): Payer: 59

## 2020-01-01 DIAGNOSIS — M25572 Pain in left ankle and joints of left foot: Secondary | ICD-10-CM

## 2020-01-01 DIAGNOSIS — S96912A Strain of unspecified muscle and tendon at ankle and foot level, left foot, initial encounter: Secondary | ICD-10-CM

## 2020-01-01 MED ORDER — ACETAMINOPHEN 325 MG PO TABS
650.0000 mg | ORAL_TABLET | Freq: Four times a day (QID) | ORAL | Status: DC | PRN
  Administered 2020-01-01: 11:00:00 650 mg via ORAL

## 2020-01-01 MED ORDER — TRAMADOL HCL 50 MG PO TABS: 50.0000 mg | ORAL_TABLET | Freq: Four times a day (QID) | ORAL | 0 refills | Status: DC | PRN

## 2020-01-01 MED ORDER — MELOXICAM 15 MG PO TABS: 15.0000 mg | ORAL_TABLET | Freq: Every day | ORAL | 1 refills | Status: DC

## 2020-01-01 NOTE — ED Provider Notes (Signed)
[name]    CSN: [id number] Arrival date & time: 01/01/20  0947      History   Chief Complaint Chief Complaint  Patient presents with  . Ankle Injury    HPI Laurie [name] is a 43 y.o. female.   HPI  Laurie [name] sustained an injury overnight in which she tripped over her pet and landed on her left foot/ankle.  She endorses hearing" crack" of her ankle after the fall.  She recalls a twisting motion of her ankle following the fall.  She has noted increasing swelling at the left ankle and significant pain at the medial and lateral malleolus.  She endorses good sensation of her distal left toes.  She has taken meloxicam, Tylenol, and iced ankle and foot last night.  Today she notes having increased level of pain, decreased range of motion with the left foot and ankle, and mild bruising.  Past Medical History:  Diagnosis Date  . Endometriosis   . Flying phobia   . 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 History of colon polyps   . History of kidney stones 08/25/2013  . Meningitis    spinal  . Obesity   . Pancreatitis, acute 08/16/2015  . Spinal headache 12/02/2011    Patient Active Problem List   Diagnosis Date Noted  . Hypertension goal BP (blood pressure) < 130/80 04/17/2019  . Family history of colorectal cancer 04/17/2019  . History of cold urticaria 01/31/2019  . Allergic urticaria 01/31/2019  . Other allergic rhinitis 01/31/2019  . History of rheumatoid arthritis 01/31/2019  . Morbid obesity with BMI of 40.0-44.9, adult (HCC) 01/31/2019  . Anaphylactic reaction due to adverse effect of correct drug or medicament properly administered, initial encounter 01/16/2019  . Tachycardia with heart rate 100-120 beats per minute 01/16/2019  . Mixed hyperlipidemia 10/18/2018  . Adult ADHD 10/17/2018  . History of hysterectomy for benign disease 10/17/2018  . Elevated blood pressure reading 10/17/2018  . Right knee pain 10/17/2018  . 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 History of colon  polyps   . Flying phobia   . Back pain 05/13/2017  . Primary osteoarthritis of both knees 03/11/2015  . Poor concentration 10/30/2014  . Breast lump 05/29/2014  . OSA (obstructive sleep apnea) 02/02/2014  . Obesity 11/09/2013  . Insulin resistance 11/09/2013  . History of kidney stones 08/25/2013  . Chronic constipation 08/04/2013    Past Surgical History:  Procedure Laterality Date  . ABDOMINAL HYSTERECTOMY    . bilateral tubal      x2  . CHOLECYSTECTOMY    . ECTOPIC PREGNANCY SURGERY    . KNEE ARTHROSCOPY Right   . TONSILLECTOMY    . TUBAL LIGATION      OB History    Gravida  2   Para  1   Term      Preterm      AB  1   Living  2     SAB      TAB      Ectopic  1   Multiple      Live Births               Home Medications    Prior to Admission medications   Medication Sig Start Date End Date Taking? Authorizing Provider  cetirizine (ZYRTEC) 10 MG tablet Take 1 tablet (10 mg total) by mouth at bedtime. Patient not taking: Reported on 05/08/2019 01/16/19   Carlis Stable, PA-C  cyproheptadine (PERIACTIN) 4 MG tablet Take  1 tablet (4 mg total) by mouth 3 (three) times daily as needed (for hives from cold temperatures). Patient not taking: Reported on 05/08/2019 01/31/19   Charlies Silvers, MD  ipratropium (ATROVENT) 0.06 % nasal spray Place 2 sprays into both nostrils 4 (four) times daily. 11/13/19   Breeback, Royetta Car, PA-C  valACYclovir (VALTREX) 1000 MG tablet Take 1 tablet (1,000 mg total) by mouth 2 (two) times daily. Patient not taking: Reported on 05/08/2019 01/30/15   Emily Filbert, MD    Family History Family History  Problem Relation Age of Onset  . Thyroid disease Mother   . Allergic rhinitis Mother   . Cancer Paternal Grandfather        colon  . Colon cancer Paternal Grandfather   . Cancer Maternal Grandfather        colon  . Hyperlipidemia Maternal Grandfather   . Hypertension Maternal Grandfather   . Colon cancer Maternal  Grandfather   . Throat cancer Maternal Grandfather   . Alcohol abuse Father   . Diabetes Father   . Hyperlipidemia Maternal Grandmother   . Hypertension Maternal Grandmother   . Thyroid disease Maternal Grandmother   . Diabetes Paternal Grandmother   . Asthma Neg Hx   . Eczema Neg Hx   . Urticaria Neg Hx   . Immunodeficiency Neg Hx   . Angioedema Neg Hx     Social History Social History   Tobacco Use  . Smoking status: Former Smoker    Packs/day: 2.00    Years: 20.00    Pack years: 40.00    Types: Cigarettes    Quit date: 10/18/2007    Years since quitting: 12.2  . Smokeless tobacco: Never Used  Substance Use Topics  . Alcohol use: Yes    Alcohol/week: 5.0 - 7.0 standard drinks    Types: 5 - 7 Standard drinks or equivalent per week    Comment: weekly  . Drug use: No     Allergies   Vyvanse [lisdexamfetamine dimesylate] and Wellbutrin [bupropion]   Review of Systems Review of Systems Pertinent negatives listed in HPI  Physical Exam Triage Vital Signs ED Triage Vitals  Enc Vitals Group     BP 01/01/20 1029 (!) 152/98     Pulse Rate 01/01/20 1029 85     Resp 01/01/20 1029 20     Temp 01/01/20 1031 99.6 F (37.6 C)     Temp Source 01/01/20 1029 Oral     SpO2 01/01/20 1029 99 %     Weight 01/01/20 1030 267 lb (121.1 kg)     Height 01/01/20 1030 5\' 11"  (1.803 m)     Head Circumference --      Peak Flow --      Pain Score 01/01/20 1029 6     Pain Loc --      Pain Edu? --      Excl. in [place]? --    No data found.  Updated Vital Signs BP (!) 152/98 (BP Location: Right Arm)   Pulse 85   Temp 99.6 F (37.6 C)   Resp 20   Ht 5\' 11"  (1.803 m)   Wt 267 lb (121.1 kg)   SpO2 99%   BMI 37.24 kg/m   Visual Acuity Right Eye Distance:   Left Eye Distance:   Bilateral Distance:    Right Eye Near:   Left Eye Near:    Bilateral Near:     Physical Exam General appearance: alert, well developed, well nourished,  cooperative and in no distress Head:  Normocephalic, without obvious abnormality, atraumatic Respiratory: Respirations even and unlabored, normal respiratory rate Heart: rate and rhythm normal. No gallop or murmurs noted on exam  Abdomen: BS +, no distention, no rebound tenderness, or no mass Extremities: Positive left ankle edema, tenderness talus, lateral/medial malleolus   Skin: Skin color, texture, turgor normal. No rashes seen  Psych: Appropriate mood and affect. Neurologic: Men Alert, oriented to person, place, and time, thought content appropriate.   UC Treatments / Results  Labs (all labs ordered are listed, but only abnormal results are displayed) Labs Reviewed - No data to display  EKG   Radiology No results found.  Procedures Procedures (including critical care time)  Medications Ordered in UC Medications - No data to display  Initial Impression / Assessment and Plan / UC Course  I have reviewed the triage vital signs and the nursing notes.  Pertinent labs & imaging results that were available during my care of the patient were reviewed by me and considered in my medical decision making (see chart for details).   Left ankle strain following a fall. Imaging negative for fracture. Pt at present unable to bear-weight. Will place in ASO ankle splint. Pt offered crutches-has a pair at home. Encourage to continue to ice to reduce swelling. Short course of tramadol provided for pain. Daily PRN Meloxicam provided to reduce inflammation and associated pain. Provided contact information for Dr. Benjamin Stain, Sports Medicine, for futher follow-up if ankle tenderness and ability to bear weight doesn't significant improve within 48-72 hours. Patient verbalized understanding and agreement with plan. Final Clinical Impressions(s) / UC Diagnoses   Final diagnoses:  Strain of left ankle, initial encounter     Discharge Instructions     Continue to elevate left ankle, apply ice to decrease swelling, continue meloxicam  to reduce inflammation, weightbearing activity only as tolerated.  Follow-up with Dr. Karie Schwalbe in the next 3 to 5 days for reevaluation.  Continue ankle stabilizer until able to completely bear weight without experiencing pain.  If symptoms worsen or do not improve return for further evaluation.    ED Prescriptions    Medication Sig Dispense Auth. Provider   traMADol (ULTRAM) 50 MG tablet Take 1 tablet (50 mg total) by mouth every 6 (six) hours as needed. 15 tablet Bing Neighbors, FNP   meloxicam (MOBIC) 15 MG tablet Take 1 tablet (15 mg total) by mouth daily. 30 tablet Bing Neighbors, FNP    According to the [facility] Chronic Pain Initiative program, licensed prescribers of controlled substances are required to review details related to analgesia, adverse effects, or aberrant behaviors related to any prescribed controlled medication. I have reviewed [place] Substance Reporting-PMP AWARE system prior to prescribing opiate medication, no inconsistencies found related to Laurie [name].        Bing Neighbors, FNP 01/03/20 1235

## 2020-01-01 NOTE — ED Triage Notes (Signed)
Around 7 pm last night tripped over the dog, and fell on left ankle.  Painful to walk, bruising and swelling noted

## 2020-01-01 NOTE — Discharge Instructions (Addendum)
Continue to elevate left ankle, apply ice to decrease swelling, continue meloxicam to reduce inflammation, weightbearing activity only as tolerated.  Follow-up with Dr. Karie Schwalbe in the next 3 to 5 days for reevaluation.  Continue ankle stabilizer until able to completely bear weight without experiencing pain.  If symptoms worsen or do not improve return for further evaluation.

## 2020-04-17 ENCOUNTER — Telehealth (INDEPENDENT_AMBULATORY_CARE_PROVIDER_SITE_OTHER): Payer: BC Managed Care – PPO | Admitting: Sports Medicine

## 2020-04-17 ENCOUNTER — Telehealth: Payer: BC Managed Care – PPO | Admitting: Family

## 2020-04-17 DIAGNOSIS — B9689 Other specified bacterial agents as the cause of diseases classified elsewhere: Secondary | ICD-10-CM | POA: Diagnosis not present

## 2020-04-17 DIAGNOSIS — J3089 Other allergic rhinitis: Secondary | ICD-10-CM | POA: Diagnosis not present

## 2020-04-17 DIAGNOSIS — J208 Acute bronchitis due to other specified organisms: Secondary | ICD-10-CM | POA: Diagnosis not present

## 2020-04-17 MED ORDER — FEXOFENADINE HCL 180 MG PO TABS: 180.0000 mg | ORAL_TABLET | Freq: Every day | ORAL | 3 refills | Status: AC

## 2020-04-17 MED ORDER — BENZONATATE 100 MG PO CAPS: 100.0000 mg | ORAL_CAPSULE | Freq: Three times a day (TID) | ORAL | 0 refills | Status: DC | PRN

## 2020-04-17 MED ORDER — TRIAMCINOLONE ACETONIDE 55 MCG/ACT NA AERO: 2.0000 | INHALATION_SPRAY | Freq: Every day | NASAL | 11 refills | Status: DC

## 2020-04-17 MED ORDER — AZELASTINE HCL 0.1 % NA SOLN: 2.0000 | Freq: Two times a day (BID) | NASAL | 1 refills | Status: DC

## 2020-04-17 MED ORDER — AZITHROMYCIN 250 MG PO TABS: ORAL_TABLET | ORAL | 0 refills | Status: DC

## 2020-04-17 MED ORDER — MONTELUKAST SODIUM 10 MG PO TABS: 10.0000 mg | ORAL_TABLET | Freq: Every day | ORAL | 3 refills | Status: DC

## 2020-04-17 NOTE — Progress Notes (Signed)
We are sorry that you are not feeling well.  Here is how we plan to help!  Based on your presentation I believe you most likely have A cough due to bacteria.  When patients have a fever and a productive cough with a change in color or increased sputum production, we are concerned about bacterial bronchitis.  If left untreated it can progress to pneumonia.  If your symptoms do not improve with your treatment plan it is important that you contact your provider.   I have prescribed Azithromyin 250 mg: two tablets now and then one tablet daily for 4 additonal days    In addition you may use A non-prescription cough medication called Robitussin DAC. Take 2 teaspoons every 8 hours or Delsym: take 2 teaspoons every 12 hours., A non-prescription cough medication called Mucinex DM: take 2 tablets every 12 hours. and A prescription cough medication called Tessalon Perles 100mg . You may take 1-2 capsules every 8 hours as needed for your cough.  Given she has been taking OTC medication for over a week with no improvement, we will treat with antibiotics.   From your responses in the eVisit questionnaire you describe inflammation in the upper respiratory tract which is causing a significant cough.  This is commonly called Bronchitis and has four common causes:    Allergies  Viral Infections  Acid Reflux  Bacterial Infection Allergies, viruses and acid reflux are treated by controlling symptoms or eliminating the cause. An example might be a cough caused by taking certain blood pressure medications. You stop the cough by changing the medication. Another example might be a cough caused by acid reflux. Controlling the reflux helps control the cough.  USE OF BRONCHODILATOR ("RESCUE") INHALERS: There is a risk from using your bronchodilator too frequently.  The risk is that over-reliance on a medication which only relaxes the muscles surrounding the breathing tubes can reduce the effectiveness of medications  prescribed to reduce swelling and congestion of the tubes themselves.  Although you feel brief relief from the bronchodilator inhaler, your asthma may actually be worsening with the tubes becoming more swollen and filled with mucus.  This can delay other crucial treatments, such as oral steroid medications. If you need to use a bronchodilator inhaler daily, several times per day, you should discuss this with your provider.  There are probably better treatments that could be used to keep your asthma under control.     HOME CARE . Only take medications as instructed by your medical team. . Complete the entire course of an antibiotic. . Drink plenty of fluids and get plenty of rest. . Avoid close contacts especially the very young and the elderly . Cover your mouth if you cough or cough into your sleeve. . Always remember to wash your hands . A steam or ultrasonic humidifier can help congestion.   GET HELP RIGHT AWAY IF: . You develop worsening fever. . You become short of breath . You cough up blood. . Your symptoms persist after you have completed your treatment plan MAKE SURE YOU   Understand these instructions.  Will watch your condition.  Will get help right away if you are not doing well or get worse.  Your e-visit answers were reviewed by a board certified advanced clinical practitioner to complete your personal care plan.  Depending on the condition, your plan could have included both over the counter or prescription medications. If there is a problem please reply  once you have received a response from  your provider. Your safety is important to [place].  If you have drug allergies check your prescription carefully.    You can use MyChart to ask questions about today's visit, request a non-urgent call back, or ask for a work or school excuse for 24 hours related to this e-Visit. If it has been greater than 24 hours you will need to follow up with your provider, or enter a new e-Visit to  address those concerns. You will get an e-mail in the next two days asking about your experience.  I hope that your e-visit has been valuable and will speed your recovery. Thank you for using e-visits.

## 2020-04-17 NOTE — Assessment & Plan Note (Signed)
This is a pleasant 44 year old female, for the past couple of days she has had increasing runny nose, postnasal drip, mild sore throat, no overt fevers, chills, no cough, no muscle aches or body aches, she did just have her COVID-19 vaccine. She has seasonal allergies typically about this time, and her symptoms feel similar albeit more severe. She tried some nasal decongestants, Zyrtec, I am but overhaul her whole allergy regimen. Discontinue all nasal sprays, antihistamines. Starting Allegra, Singulair, Nasacort, and Astelin. We can follow this up in 2 weeks. Typically I be using a burst of steroids to get things started but we cannot in light of her recent COVID-19 vaccine. If ineffective we could certainly add Benadryl at bedtime.

## 2020-04-17 NOTE — Progress Notes (Signed)
   Virtual Visit via WebEx/MyChart   I connected with  Flara Storti Hamman  on 04/17/20 via WebEx/MyChart/Doximity Video and verified that I am speaking with the correct person using two identifiers.   I discussed the limitations, risks, security and privacy concerns of performing an evaluation and management service by WebEx/MyChart/Doximity Video, including the higher likelihood of inaccurate diagnosis and treatment, and the availability of in person appointments.  We also discussed the likely need of an additional face to face encounter for complete and high quality delivery of care.  I also discussed with the patient that there may be a patient responsible charge related to this service. The patient expressed understanding and wishes to proceed.  Provider location is either at home or medical facility. Patient location is at their home, different from provider location. People involved in care of the patient during this telehealth encounter were myself, my nurse/medical assistant, and my front office/scheduling team member.  Review of Systems: No fevers, chills, night sweats, weight loss, chest pain, or shortness of breath.   Objective Findings:    General: Speaking full sentences, no audible heavy breathing.  Sounds alert and appropriately interactive.  Appears well.  Face symmetric.  Extraocular movements intact.  Pupils equal and round.  No nasal flaring or accessory muscle use visualized.  Independent interpretation of tests performed by another provider:   None.  Brief History, Exam, Impression, and Recommendations:    Other allergic rhinitis This is a pleasant 44 year old female, for the past couple of days she has had increasing runny nose, postnasal drip, mild sore throat, no overt fevers, chills, no cough, no muscle aches or body aches, she did just have her COVID-19 vaccine. She has seasonal allergies typically about this time, and her symptoms feel similar albeit more severe.  She tried some nasal decongestants, Zyrtec, I am but overhaul her whole allergy regimen. Discontinue all nasal sprays, antihistamines. Starting Allegra, Singulair, Nasacort, and Astelin. We can follow this up in 2 weeks. Typically I be using a burst of steroids to get things started but we cannot in light of her recent COVID-19 vaccine. If ineffective we could certainly add Benadryl at bedtime.   I discussed the above assessment and treatment plan with the patient. The patient was provided an opportunity to ask questions and all were answered. The patient agreed with the plan and demonstrated an understanding of the instructions.   The patient was advised to call back or seek an in-person evaluation if the symptoms worsen or if the condition fails to improve as anticipated.   I provided 30 minutes of face to face and non-face-to-face time during this encounter date, time was needed to gather information, review chart, records, communicate/coordinate with staff remotely, as well as complete documentation.   ___________________________________________ Ihor Austin. Benjamin Stain, M.D., ABFM., CAQSM. Primary Care and Sports Medicine [facility]  Adjunct Instructor of Family Medicine  [employer]

## 2020-04-24 ENCOUNTER — Other Ambulatory Visit: Payer: Self-pay | Admitting: Family

## 2020-04-24 NOTE — Progress Notes (Signed)
Approximately 5 minutes was spent documenting and reviewing patient's chart.   

## 2020-04-29 NOTE — Patient Instructions (Signed)
DASH Eating Plan DASH stands for "Dietary Approaches to Stop Hypertension." The DASH eating plan is a healthy eating plan that has been shown to reduce high blood pressure (hypertension). It may also reduce your risk for type 2 diabetes, heart disease, and stroke. The DASH eating plan may also help with weight loss. What are tips for following this plan?  General guidelines  Avoid eating more than 2,300 mg (milligrams) of salt (sodium) a day. If you have hypertension, you may need to reduce your sodium intake to 1,500 mg a day.  Limit alcohol intake to no more than 1 drink a day for nonpregnant women and 2 drinks a day for men. One drink equals 12 oz of beer, 5 oz of wine, or 1 oz of hard liquor.  Work with your health care provider to maintain a healthy body weight or to lose weight. Ask what an ideal weight is for you.  Get at least 30 minutes of exercise that causes your heart to beat faster (aerobic exercise) most days of the week. Activities may include walking, swimming, or biking.  Work with your health care provider or diet and nutrition specialist (dietitian) to adjust your eating plan to your individual calorie needs. Reading food labels   Check food labels for the amount of sodium per serving. Choose foods with less than 5 percent of the Daily Value of sodium. Generally, foods with less than 300 mg of sodium per serving fit into this eating plan.  To find whole grains, look for the word "whole" as the first word in the ingredient list. Shopping  Buy products labeled as "low-sodium" or "no salt added."  Buy fresh foods. Avoid canned foods and premade or frozen meals. Cooking  Avoid adding salt when cooking. Use salt-free seasonings or herbs instead of table salt or sea salt. Check with your health care provider or pharmacist before using salt substitutes.  Do not fry foods. Cook foods using healthy methods such as baking, boiling, grilling, and broiling instead.  Cook with  heart-healthy oils, such as olive, canola, soybean, or sunflower oil. Meal planning  Eat a balanced diet that includes: ? 5 or more servings of fruits and vegetables each day. At each meal, try to fill half of your plate with fruits and vegetables. ? Up to 6-8 servings of whole grains each day. ? Less than 6 oz of lean meat, poultry, or fish each day. A 3-oz serving of meat is about the same size as a deck of cards. One egg equals 1 oz. ? 2 servings of low-fat dairy each day. ? A serving of nuts, seeds, or beans 5 times each week. ? Heart-healthy fats. Healthy fats called Omega-3 fatty acids are found in foods such as flaxseeds and coldwater fish, like sardines, salmon, and mackerel.  Limit how much you eat of the following: ? Canned or prepackaged foods. ? Food that is high in trans fat, such as fried foods. ? Food that is high in saturated fat, such as fatty meat. ? Sweets, desserts, sugary drinks, and other foods with added sugar. ? Full-fat dairy products.  Do not salt foods before eating.  Try to eat at least 2 vegetarian meals each week.  Eat more home-cooked food and less restaurant, buffet, and fast food.  When eating at a restaurant, ask that your food be prepared with less salt or no salt, if possible. What foods are recommended? The items listed may not be a complete list. Talk with your dietitian about   what dietary choices are best for you. Grains Whole-grain or whole-wheat bread. Whole-grain or whole-wheat pasta. Brown rice. Oatmeal. Quinoa. Bulgur. Whole-grain and low-sodium cereals. Pita bread. Low-fat, low-sodium crackers. Whole-wheat flour tortillas. Vegetables Fresh or frozen vegetables (raw, steamed, roasted, or grilled). Low-sodium or reduced-sodium tomato and vegetable juice. Low-sodium or reduced-sodium tomato sauce and tomato paste. Low-sodium or reduced-sodium canned vegetables. Fruits All fresh, dried, or frozen fruit. Canned fruit in natural juice (without  added sugar). Meat and other protein foods Skinless chicken or turkey. Ground chicken or turkey. Pork with fat trimmed off. Fish and seafood. Egg whites. Dried beans, peas, or lentils. Unsalted nuts, nut butters, and seeds. Unsalted canned beans. Lean cuts of beef with fat trimmed off. Low-sodium, lean deli meat. Dairy Low-fat (1%) or fat-free (skim) milk. Fat-free, low-fat, or reduced-fat cheeses. Nonfat, low-sodium ricotta or cottage cheese. Low-fat or nonfat yogurt. Low-fat, low-sodium cheese. Fats and oils Soft margarine without trans fats. Vegetable oil. Low-fat, reduced-fat, or light mayonnaise and salad dressings (reduced-sodium). Canola, safflower, olive, soybean, and sunflower oils. Avocado. Seasoning and other foods Herbs. Spices. Seasoning mixes without salt. Unsalted popcorn and pretzels. Fat-free sweets. What foods are not recommended? The items listed may not be a complete list. Talk with your dietitian about what dietary choices are best for you. Grains Baked goods made with fat, such as croissants, muffins, or some breads. Dry pasta or rice meal packs. Vegetables Creamed or fried vegetables. Vegetables in a cheese sauce. Regular canned vegetables (not low-sodium or reduced-sodium). Regular canned tomato sauce and paste (not low-sodium or reduced-sodium). Regular tomato and vegetable juice (not low-sodium or reduced-sodium). Pickles. Olives. Fruits Canned fruit in a light or heavy syrup. Fried fruit. Fruit in cream or butter sauce. Meat and other protein foods Fatty cuts of meat. Ribs. Fried meat. Bacon. Sausage. Bologna and other processed lunch meats. Salami. Fatback. Hotdogs. Bratwurst. Salted nuts and seeds. Canned beans with added salt. Canned or smoked fish. Whole eggs or egg yolks. Chicken or turkey with skin. Dairy Whole or 2% milk, cream, and half-and-half. Whole or full-fat cream cheese. Whole-fat or sweetened yogurt. Full-fat cheese. Nondairy creamers. Whipped toppings.  Processed cheese and cheese spreads. Fats and oils Butter. Stick margarine. Lard. Shortening. Ghee. Bacon fat. Tropical oils, such as coconut, palm kernel, or palm oil. Seasoning and other foods Salted popcorn and pretzels. Onion salt, garlic salt, seasoned salt, table salt, and sea salt. Worcestershire sauce. Tartar sauce. Barbecue sauce. Teriyaki sauce. Soy sauce, including reduced-sodium. Steak sauce. Canned and packaged gravies. Fish sauce. Oyster sauce. Cocktail sauce. Horseradish that you find on the shelf. Ketchup. Mustard. Meat flavorings and tenderizers. Bouillon cubes. Hot sauce and Tabasco sauce. Premade or packaged marinades. Premade or packaged taco seasonings. Relishes. Regular salad dressings. Where to find more information:  National Heart, Lung, and Blood Institute: www.nhlbi.nih.gov  American Heart Association: www.heart.org Summary  The DASH eating plan is a healthy eating plan that has been shown to reduce high blood pressure (hypertension). It may also reduce your risk for type 2 diabetes, heart disease, and stroke.  With the DASH eating plan, you should limit salt (sodium) intake to 2,300 mg a day. If you have hypertension, you may need to reduce your sodium intake to 1,500 mg a day.  When on the DASH eating plan, aim to eat more fresh fruits and vegetables, whole grains, lean proteins, low-fat dairy, and heart-healthy fats.  Work with your health care provider or diet and nutrition specialist (dietitian) to adjust your eating plan to your   individual calorie needs. This information is not intended to replace advice given to you by your health care provider. Make sure you discuss any questions you have with your health care provider. Document Revised: 11/26/2017 Document Reviewed: 12/07/2016 Elsevier Patient Education  2020 Elsevier Inc.  

## 2020-04-29 NOTE — Progress Notes (Signed)
Subjective:    CC: establish care, chronic conditions follow up  HPI: Pleasant 44 year old female presenting today to establish care with a new PCP and discuss chronic conditions as below.  HTN- BP elevated today.  Recently checked at home- 120/80s.  No current medications to manage blood pressure.  Denies chest pain, shortness of breath, palpitations, lower extremity edema, dizziness, and headaches.  HLD-no prior or current medications.  Diet managed.  OSA-not currently using CPAP.  Adult ADHD- used to take Vyvanse but stopped. Works 50-60 hrs a week. Designer, fashion/clothing for business.  Genital HSV-no recent outbreaks.  Takes Valtrex as needed.  OA of bilateral knees-continues to have difficulty with knee pain.  Plans to follow-up with Dr. Darene Lamer to see if insurance will approve another round of Synvisc.  New concerns today:  Weight gain- exercise 45 minutes several times a week. [place] Tries Qsymia before which worked well for her.  Is interested in restarting Qsymia.  Left fourth toe-reports that she occasionally develops swelling, erythema, and itching to her left fourth toe followed by drainage of thin serous discharge.  Couple days after this starts the skin starts to peel around the toenail and distal end of the toe.  This is happened several times over the past year, usually when wearing closed toe shoes that cause her feet to sweat more.  I reviewed the past medical history, family history, social history, surgical history, and allergies today and no changes were needed.  Please see the problem list section below in epic for further details.  Past Medical History: Past Medical History:  Diagnosis Date  . Endometriosis   . Flying phobia   . 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 History of colon polyps   . History of kidney stones 08/25/2013  . Meningitis    spinal  . Obesity   . Pancreatitis, acute 08/16/2015  . Spinal headache 12/02/2011   Past Surgical  History: Past Surgical History:  Procedure Laterality Date  . ABDOMINAL HYSTERECTOMY    . bilateral tubal      x2  . CHOLECYSTECTOMY    . ECTOPIC PREGNANCY SURGERY    . KNEE ARTHROSCOPY Right   . TONSILLECTOMY    . TUBAL LIGATION     Social History: Social History   Socioeconomic History  . Marital status: Married    Spouse name: Not on file  . Number of children: Not on file  . Years of education: Not on file  . Highest education level: Not on file  Occupational History  . Not on file  Tobacco Use  . Smoking status: Former Smoker    Packs/day: 2.00    Years: 20.00    Pack years: 40.00    Types: Cigarettes    Quit date: 10/18/2007    Years since quitting: 12.5  . Smokeless tobacco: Never Used  Substance and Sexual Activity  . Alcohol use: Yes    Alcohol/week: 12.0 - 16.0 standard drinks    Types: 12 - 16 Cans of beer per week  . Drug use: No  . Sexual activity: Yes    Partners: Male    Birth control/protection: Surgical  Other Topics Concern  . Not on file  Social History Narrative  . Not on file   Social Determinants of Health   Financial Resource Strain:   . Difficulty of Paying Living Expenses:   Food Insecurity:   . Worried About Charity fundraiser in the Last Year:   . Ran  Out of Food in the Last Year:   Transportation Needs:   . Lack of Transportation (Medical):   Marland Kitchen Lack of Transportation (Non-Medical):   Physical Activity:   . Days of Exercise per Week:   . Minutes of Exercise per Session:   Stress:   . Feeling of Stress :   Social Connections:   . Frequency of Communication with Friends and Family:   . Frequency of Social Gatherings with Friends and Family:   . Attends Religious Services:   . Active Member of Clubs or Organizations:   . Attends Banker Meetings:   Marland Kitchen Marital Status:    Family History: Family History  Problem Relation Age of Onset  . Thyroid disease Mother   . Allergic rhinitis Mother   . Cancer Paternal  Grandfather        colon  . Colon cancer Paternal Grandfather   . Cancer Maternal Grandfather        colon  . Hyperlipidemia Maternal Grandfather   . Hypertension Maternal Grandfather   . Colon cancer Maternal Grandfather   . Throat cancer Maternal Grandfather   . Alcohol abuse Father   . Diabetes Father   . Hyperlipidemia Maternal Grandmother   . Hypertension Maternal Grandmother   . Thyroid disease Maternal Grandmother   . Diabetes Paternal Grandmother   . Asthma Neg Hx   . Eczema Neg Hx   . Urticaria Neg Hx   . Immunodeficiency Neg Hx   . Angioedema Neg Hx    Allergies: Allergies  Allergen Reactions  . Wellbutrin [Bupropion] Other (See Comments)    "did not feel like myself"   Medications: See med rec.  Review of Systems: No fevers, chills, night sweats, weight loss, chest pain, or shortness of breath.   Objective:    General: Well Developed, well nourished, and in no acute distress.  Neuro: Alert and oriented x3.  HEENT: Normocephalic, atraumatic.  Skin: Warm and dry. Cardiac: Regular rate and rhythm, no murmurs rubs or gallops, no lower extremity edema.  Respiratory: Clear to auscultation bilaterally. Not using accessory muscles, speaking in full sentences.   Impression and Recommendations:    1. Hypertension goal BP (blood pressure) < 130/80 Discussed blood pressure goal of less than 130/80.  Not currently on medication and will not start today.  Recommend DASH diet, weight loss, and increased exercise.  Checking CBC CMP today.  Advised patient to check blood pressures at home daily for approximately 2 weeks and to send those readings to evaluate if we need to start medication. - CBC - COMPLETE METABOLIC PANEL WITH GFR  2. Mixed hyperlipidemia Checking lipid panel today.  Recommend low-fat heart healthy diet. - Lipid panel  3. Morbid obesity with BMI of 40.0-44.9, adult (HCC) Starting Qsymia.  Continue increased exercise and work on eating a low calorie  heart healthy diet.  4. Adult ADHD Not interested in restarting medication for this at this time.  5. Genital herpes simplex, unspecified site Continue Valtrex as needed.  6. Primary osteoarthritis of both knees Follow-up with Dr. Karie Schwalbe for possible Synvisc at your convenience.  7. Paronychia of fourth toe, left Discussed causes and treatment of paronychia.  So far these have been resolving on their own.  If one develops that does not resolve spontaneously, may benefit from oral antibiotics.  May use triple antibiotic ointment topically if desired.  8. Screening for thyroid disorder Family history of thyroid disease in close female relatives and recent weight gain.  Checking TSH  today. - TSH  Return in about 4 weeks (around 05/28/2020) for weight check.  ___________________________________________ Thayer Ohm, DNP, APRN, FNP-BC Primary Care and Sports Medicine [facility]

## 2020-04-30 ENCOUNTER — Ambulatory Visit: Payer: BC Managed Care – PPO | Admitting: Medical-Surgical

## 2020-04-30 ENCOUNTER — Other Ambulatory Visit: Payer: Self-pay

## 2020-04-30 ENCOUNTER — Encounter: Payer: Self-pay | Admitting: Physician Assistant

## 2020-04-30 ENCOUNTER — Encounter: Payer: Self-pay | Admitting: Medical-Surgical

## 2020-04-30 VITALS — BP 146/91 | HR 74 | Temp 98.2°F | Ht 69.0 in | Wt 271.8 lb

## 2020-04-30 DIAGNOSIS — A6000 Herpesviral infection of urogenital system, unspecified: Secondary | ICD-10-CM

## 2020-04-30 DIAGNOSIS — M170 Bilateral primary osteoarthritis of knee: Secondary | ICD-10-CM

## 2020-04-30 DIAGNOSIS — Z114 Encounter for screening for human immunodeficiency virus [HIV]: Secondary | ICD-10-CM | POA: Diagnosis not present

## 2020-04-30 DIAGNOSIS — Z1329 Encounter for screening for other suspected endocrine disorder: Secondary | ICD-10-CM | POA: Diagnosis not present

## 2020-04-30 DIAGNOSIS — I10 Essential (primary) hypertension: Secondary | ICD-10-CM

## 2020-04-30 DIAGNOSIS — E782 Mixed hyperlipidemia: Secondary | ICD-10-CM | POA: Diagnosis not present

## 2020-04-30 DIAGNOSIS — F909 Attention-deficit hyperactivity disorder, unspecified type: Secondary | ICD-10-CM

## 2020-04-30 DIAGNOSIS — L03032 Cellulitis of left toe: Secondary | ICD-10-CM

## 2020-04-30 DIAGNOSIS — Z6841 Body Mass Index (BMI) 40.0 and over, adult: Secondary | ICD-10-CM

## 2020-04-30 MED ORDER — PHENTERMINE-TOPIRAMATE ER 3.75-23 MG PO CP24: 1.0000 | ORAL_CAPSULE | Freq: Every morning | ORAL | 0 refills | Status: DC

## 2020-04-30 MED ORDER — TOPIRAMATE 50 MG PO TABS: ORAL_TABLET | ORAL | 0 refills | Status: DC

## 2020-04-30 MED ORDER — PHENTERMINE HCL 8 MG PO TABS: ORAL_TABLET | ORAL | 0 refills | Status: DC

## 2020-04-30 NOTE — Telephone Encounter (Signed)
PA or change med? Please advise

## 2020-05-01 LAB — CBC
HCT: 42.8 % (ref 35.0–45.0)
Hemoglobin: 14.6 g/dL (ref 11.7–15.5)
MCH: 31.7 pg (ref 27.0–33.0)
MCHC: 34.1 g/dL (ref 32.0–36.0)
MCV: 93 fL (ref 80.0–100.0)
MPV: 11.6 fL (ref 7.5–12.5)
Platelets: 230 10*3/uL (ref 140–400)
RBC: 4.6 10*6/uL (ref 3.80–5.10)
RDW: 12.8 % (ref 11.0–15.0)
WBC: 6.1 10*3/uL (ref 3.8–10.8)

## 2020-05-01 LAB — TSH: TSH: 2.55 mIU/L

## 2020-05-01 LAB — COMPLETE METABOLIC PANEL WITH GFR
AG Ratio: 1.7 (calc) (ref 1.0–2.5)
ALT: 30 U/L — ABNORMAL HIGH (ref 6–29)
AST: 23 U/L (ref 10–30)
Albumin: 4.5 g/dL (ref 3.6–5.1)
Alkaline phosphatase (APISO): 84 U/L (ref 31–125)
BUN: 12 mg/dL (ref 7–25)
CO2: 29 mmol/L (ref 20–32)
Calcium: 9.9 mg/dL (ref 8.6–10.2)
Chloride: 104 mmol/L (ref 98–110)
Creat: 0.81 mg/dL (ref 0.50–1.10)
GFR, Est African American: 104 mL/min/{1.73_m2} (ref >=60)
GFR, Est Non African American: 90 mL/min/{1.73_m2} (ref >=60)
Globulin: 2.7 g/dL (calc) (ref 1.9–3.7)
Glucose, Bld: 97 mg/dL (ref 65–99)
Potassium: 4.2 mmol/L (ref 3.5–5.3)
Sodium: 141 mmol/L (ref 135–146)
Total Bilirubin: 0.7 mg/dL (ref 0.2–1.2)
Total Protein: 7.2 g/dL (ref 6.1–8.1)

## 2020-05-01 LAB — LIPID PANEL
Cholesterol: 283 mg/dL — ABNORMAL HIGH (ref <200)
HDL: 52 mg/dL (ref >=50)
LDL Cholesterol (Calc): 196 mg/dL (calc) — ABNORMAL HIGH
Non-HDL Cholesterol (Calc): 231 mg/dL (calc) — ABNORMAL HIGH (ref <130)
Total CHOL/HDL Ratio: 5.4 (calc) — ABNORMAL HIGH (ref <5.0)
Triglycerides: 180 mg/dL — ABNORMAL HIGH (ref <150)

## 2020-05-27 NOTE — Progress Notes (Signed)
No show

## 2020-05-28 ENCOUNTER — Ambulatory Visit (INDEPENDENT_AMBULATORY_CARE_PROVIDER_SITE_OTHER): Payer: BC Managed Care – PPO | Admitting: Medical-Surgical

## 2020-05-28 DIAGNOSIS — Z5329 Procedure and treatment not carried out because of patient's decision for other reasons: Secondary | ICD-10-CM

## 2020-07-02 ENCOUNTER — Ambulatory Visit (INDEPENDENT_AMBULATORY_CARE_PROVIDER_SITE_OTHER): Payer: BC Managed Care – PPO

## 2020-07-02 ENCOUNTER — Encounter: Payer: Self-pay | Admitting: Medical-Surgical

## 2020-07-02 ENCOUNTER — Other Ambulatory Visit: Payer: Self-pay

## 2020-07-02 ENCOUNTER — Ambulatory Visit: Payer: BC Managed Care – PPO | Admitting: Medical-Surgical

## 2020-07-02 VITALS — BP 143/98 | HR 94 | Temp 98.2°F | Ht 69.0 in | Wt 273.8 lb

## 2020-07-02 DIAGNOSIS — J329 Chronic sinusitis, unspecified: Secondary | ICD-10-CM

## 2020-07-02 DIAGNOSIS — J40 Bronchitis, not specified as acute or chronic: Secondary | ICD-10-CM | POA: Diagnosis not present

## 2020-07-02 DIAGNOSIS — R21 Rash and other nonspecific skin eruption: Secondary | ICD-10-CM | POA: Diagnosis not present

## 2020-07-02 DIAGNOSIS — R05 Cough: Secondary | ICD-10-CM | POA: Diagnosis not present

## 2020-07-02 MED ORDER — ALBUTEROL SULFATE HFA 108 (90 BASE) MCG/ACT IN AERS: 2.0000 | INHALATION_SPRAY | Freq: Four times a day (QID) | RESPIRATORY_TRACT | 0 refills | Status: DC | PRN

## 2020-07-02 MED ORDER — AMOXICILLIN-POT CLAVULANATE 875-125 MG PO TABS: 1.0000 | ORAL_TABLET | Freq: Two times a day (BID) | ORAL | 0 refills | Status: AC

## 2020-07-02 NOTE — Progress Notes (Signed)
Subjective:    CC: productive cough, fever  HPI: Pleasant 44 year old female presenting with reports of productive cough, fever, ear pain, sore throat, shortness of breath, swollen cervical lymph nodes, and body aches for 6 days. Intermittent nasal congestion and rhinorrhea. Symptoms started with a sore throat that progressed to bilateral ear pain then fever with dry cough. After that, she began coughing up greenish brown mucus. She notes that she has had wheezing and some left chest discomfort with coughing. Reports she can feel the mucus worse on the left side. T max 100.1. Endorses 3 days of diarrhea, 4-5 episodes per day. Poor appetite. History of tonsillectomy. Recently travelled to [place]. Lissa Hoard for work. Has had both of her Moderna COVID vaccines. Has been taking Sudafed, Mucinex, and Tylenol with minimal relief.   I reviewed the past medical history, family history, social history, surgical history, and allergies today and no changes were needed.  Please see the problem list section below in epic for further details.  Past Medical History: Past Medical History:  Diagnosis Date  . Endometriosis   . Flying phobia   . 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 History of colon polyps   . History of kidney stones 08/25/2013  . Meningitis    spinal  . Obesity   . Pancreatitis, acute 08/16/2015  . Spinal headache 12/02/2011   Past Surgical History: Past Surgical History:  Procedure Laterality Date  . ABDOMINAL HYSTERECTOMY    . bilateral tubal      x2  . CHOLECYSTECTOMY    . ECTOPIC PREGNANCY SURGERY    . KNEE ARTHROSCOPY Right   . TONSILLECTOMY    . TUBAL LIGATION     Social History: Social History   Socioeconomic History  . Marital status: Married    Spouse name: Not on file  . Number of children: Not on file  . Years of education: Not on file  . Highest education level: Not on file  Occupational History  . Not on file  Tobacco Use  . Smoking status: Former Smoker    Packs/day: 2.00     Years: 20.00    Pack years: 40.00    Types: Cigarettes    Quit date: 10/18/2007    Years since quitting: 12.7  . Smokeless tobacco: Never Used  Vaping Use  . Vaping Use: Never used  Substance and Sexual Activity  . Alcohol use: Yes    Alcohol/week: 12.0 - 16.0 standard drinks    Types: 12 - 16 Cans of beer per week  . Drug use: No  . Sexual activity: Yes    Partners: Male    Birth control/protection: Surgical  Other Topics Concern  . Not on file  Social History Narrative  . Not on file   Social Determinants of Health   Financial Resource Strain:   . Difficulty of Paying Living Expenses:   Food Insecurity:   . Worried About Programme researcher, broadcasting/film/video in the Last Year:   . Barista in the Last Year:   Transportation Needs:   . Freight forwarder (Medical):   Marland Kitchen Lack of Transportation (Non-Medical):   Physical Activity:   . Days of Exercise per Week:   . Minutes of Exercise per Session:   Stress:   . Feeling of Stress :   Social Connections:   . Frequency of Communication with Friends and Family:   . Frequency of Social Gatherings with Friends and Family:   . Attends Religious Services:   . Active  Member of Clubs or Organizations:   . Attends Banker Meetings:   Marland Kitchen Marital Status:    Family History: Family History  Problem Relation Age of Onset  . Thyroid disease Mother   . Allergic rhinitis Mother   . Cancer Paternal Grandfather        colon  . Colon cancer Paternal Grandfather   . Cancer Maternal Grandfather        colon  . Hyperlipidemia Maternal Grandfather   . Hypertension Maternal Grandfather   . Colon cancer Maternal Grandfather   . Throat cancer Maternal Grandfather   . Alcohol abuse Father   . Diabetes Father   . Hyperlipidemia Maternal Grandmother   . Hypertension Maternal Grandmother   . Thyroid disease Maternal Grandmother   . Diabetes Paternal Grandmother   . Asthma Neg Hx   . Eczema Neg Hx   . Urticaria Neg Hx   .  Immunodeficiency Neg Hx   . Angioedema Neg Hx    Allergies: Allergies  Allergen Reactions  . Wellbutrin [Bupropion] Other (See Comments)    "did not feel like myself"   Medications: See med rec.  Review of Systems: See HPI for pertinent positives and negatives.   Objective:    General: Well Developed, well nourished, and in no acute distress.  Neuro: Alert and oriented x3.  HEENT: Normocephalic, atraumatic, pupils equal round reactive to light, neck supple, no masses, + submandibular lymphadenopathy, thyroid nonpalpable. Right TM bulging, mildly erythematous.  Skin: Warm and dry. Cardiac: Regular rate and rhythm, no murmurs rubs or gallops, no lower extremity edema.  Respiratory: Inspiratory wheezes noted to bilateral upper lobes and the left lower lobe, right lower lobe clear to auscultation. Not using accessory muscles, speaking in full sentences.   Impression and Recommendations:    1. Sinobronchitis Getting chest x-ray for SOB, chest pain with cough, and wheezing. Augmentin BID x 10 days. Albuterol inhaler as needed for wheezing. Continue supportive care at home. - DG Chest 2 View; Future - amoxicillin-clavulanate (AUGMENTIN) 875-125 MG tablet; Take 1 tablet by mouth 2 (two) times daily for 10 days.  Dispense: 20 tablet; Refill: 0  Return if symptoms worsen or fail to improve. ___________________________________________ Thayer Ohm, DNP, APRN, FNP-BC Primary Care and Sports Medicine [facility]

## 2020-07-03 DIAGNOSIS — Z20822 Contact with and (suspected) exposure to covid-19: Secondary | ICD-10-CM | POA: Diagnosis not present

## 2020-07-03 DIAGNOSIS — Z03818 Encounter for observation for suspected exposure to other biological agents ruled out: Secondary | ICD-10-CM | POA: Diagnosis not present

## 2020-07-12 DIAGNOSIS — Z20822 Contact with and (suspected) exposure to covid-19: Secondary | ICD-10-CM | POA: Diagnosis not present

## 2020-07-12 DIAGNOSIS — Z03818 Encounter for observation for suspected exposure to other biological agents ruled out: Secondary | ICD-10-CM | POA: Diagnosis not present

## 2020-11-05 ENCOUNTER — Ambulatory Visit (INDEPENDENT_AMBULATORY_CARE_PROVIDER_SITE_OTHER): Payer: BC Managed Care – PPO | Admitting: Sports Medicine

## 2020-11-05 ENCOUNTER — Telehealth: Payer: Self-pay | Admitting: Sports Medicine

## 2020-11-05 ENCOUNTER — Other Ambulatory Visit: Payer: Self-pay

## 2020-11-05 ENCOUNTER — Ambulatory Visit (INDEPENDENT_AMBULATORY_CARE_PROVIDER_SITE_OTHER): Payer: BC Managed Care – PPO

## 2020-11-05 DIAGNOSIS — F4321 Adjustment disorder with depressed mood: Secondary | ICD-10-CM | POA: Diagnosis not present

## 2020-11-05 DIAGNOSIS — M25561 Pain in right knee: Secondary | ICD-10-CM | POA: Diagnosis not present

## 2020-11-05 DIAGNOSIS — M170 Bilateral primary osteoarthritis of knee: Secondary | ICD-10-CM

## 2020-11-05 DIAGNOSIS — M1712 Unilateral primary osteoarthritis, left knee: Secondary | ICD-10-CM | POA: Diagnosis not present

## 2020-11-05 DIAGNOSIS — M7918 Myalgia, other site: Secondary | ICD-10-CM

## 2020-11-05 MED ORDER — DULOXETINE HCL 30 MG PO CPEP: 30.0000 mg | ORAL_CAPSULE | Freq: Every day | ORAL | 3 refills | Status: DC

## 2020-11-05 NOTE — Progress Notes (Signed)
    Procedures performed today:    Procedure: Real-time Ultrasound Guided injection of the right knee Device: Samsung HS60  Verbal informed consent obtained.  Time-out conducted.  Noted no overlying erythema, induration, or other signs of local infection.  Skin prepped in a sterile fashion.  Local anesthesia: Topical Ethyl chloride.  With sterile technique and under real time ultrasound guidance:  Noted mild effusion, 1 cc Kenalog 40, 2 cc lidocaine, 2 cc bupivacaine injected easily Completed without difficulty  Advised to call if fevers/chills, erythema, induration, drainage, or persistent bleeding.  Images permanently stored and available for review in PACS.  Impression: Technically successful ultrasound guided injection.  Independent interpretation of notes and tests performed by another provider:   None.  Brief History, Exam, Impression, and Recommendations:    Primary osteoarthritis of both knees This pleasant 44 year old female returns, she has known bilateral knee osteoarthritis and she is post Orthovisc in the left knee back in 2016, continues to do okay. She did have an arthroscopic debridement in the right knee recently. Still having pain likely due to the underlying osteoarthritis. Injected today, and I am going to get her approved for viscosupplementation for both knees, we can start this when approved.  Myofascial pain syndrome Increasing life stressors including several members of the family passing away, managing the estate, moves, school. As her stress is increased she has noticed increasing pain in her neck, shoulders, hips, back. There is most certainly an element of fibromyalgia going on here as well, we did discuss grief counseling, she was tearful in the exam room, as well as the mechanism of serotonin norepinephrine reuptake inhibitors. She is willing to try Cymbalta, we will start with 30 mg, she also understands that it can take 4 to 6 weeks for this  medication to kick in.  Grieving See above.    ___________________________________________ Ihor Austin. Benjamin Stain, M.D., ABFM., CAQSM. Primary Care and Sports Medicine [facility]  Adjunct Instructor of Family Medicine  [employer]

## 2020-11-05 NOTE — Assessment & Plan Note (Signed)
Increasing life stressors including several members of the family passing away, managing the estate, moves, school. As her stress is increased she has noticed increasing pain in her neck, shoulders, hips, back. There is most certainly an element of fibromyalgia going on here as well, we did discuss grief counseling, she was tearful in the exam room, as well as the mechanism of serotonin norepinephrine reuptake inhibitors. She is willing to try Cymbalta, we will start with 30 mg, she also understands that it can take 4 to 6 weeks for this medication to kick in.

## 2020-11-05 NOTE — Assessment & Plan Note (Signed)
This pleasant 44 year old female returns, she has known bilateral knee osteoarthritis and she is post [facility]ovisc in the left knee back in 2016, continues to do okay. She did have an arthroscopic debridement in the right knee recently. Still having pain likely due to the underlying osteoarthritis. Injected today, and I am going to get her approved for viscosupplementation for both knees, we can start this when approved.

## 2020-11-05 NOTE — Telephone Encounter (Signed)
Please work on bilateral viscosupplementation approval

## 2020-11-05 NOTE — Assessment & Plan Note (Addendum)
See above

## 2020-11-06 NOTE — Telephone Encounter (Signed)
Submitted [facility] for approval waiting on BID and to see if PA is needed - CF 

## 2020-12-17 ENCOUNTER — Ambulatory Visit: Payer: BC Managed Care – PPO | Admitting: Sports Medicine

## 2021-02-24 ENCOUNTER — Emergency Department
Admission: RE | Admit: 2021-02-24 | Discharge: 2021-02-24 | Disposition: A | Discharge status: Home / Self Care | Payer: BC Managed Care – PPO | Source: Ambulatory Visit

## 2021-02-24 ENCOUNTER — Emergency Department (INDEPENDENT_AMBULATORY_CARE_PROVIDER_SITE_OTHER): Payer: BC Managed Care – PPO

## 2021-02-24 ENCOUNTER — Other Ambulatory Visit: Payer: Self-pay

## 2021-02-24 VITALS — BP 158/99 | HR 98 | Temp 98.9°F | Resp 18

## 2021-02-24 DIAGNOSIS — R1012 Left upper quadrant pain: Secondary | ICD-10-CM

## 2021-02-24 DIAGNOSIS — R112 Nausea with vomiting, unspecified: Secondary | ICD-10-CM | POA: Diagnosis not present

## 2021-02-24 DIAGNOSIS — K760 Fatty (change of) liver, not elsewhere classified: Secondary | ICD-10-CM | POA: Diagnosis not present

## 2021-02-24 DIAGNOSIS — R1110 Vomiting, unspecified: Secondary | ICD-10-CM | POA: Diagnosis not present

## 2021-02-24 DIAGNOSIS — R509 Fever, unspecified: Secondary | ICD-10-CM | POA: Diagnosis not present

## 2021-02-24 DIAGNOSIS — R109 Unspecified abdominal pain: Secondary | ICD-10-CM | POA: Diagnosis not present

## 2021-02-24 LAB — POCT URINALYSIS DIP (MANUAL ENTRY)
Bilirubin, UA: NEGATIVE
Blood, UA: NEGATIVE
Glucose, UA: NEGATIVE mg/dL
Ketones, POC UA: NEGATIVE mg/dL
Leukocytes, UA: NEGATIVE
Nitrite, UA: NEGATIVE
Spec Grav, UA: 1.025 (ref 1.010–1.025)
Urobilinogen, UA: 0.2 E.U./dL
pH, UA: 6.5 (ref 5.0–8.0)

## 2021-02-24 MED ORDER — ALUM & MAG HYDROXIDE-SIMETH 200-200-20 MG/5ML PO SUSP
30.0000 mL | Freq: Once | ORAL | Status: AC
  Administered 2021-02-24: 30 mL via ORAL

## 2021-02-24 MED ORDER — LIDOCAINE VISCOUS HCL 2 % MT SOLN
15.0000 mL | Freq: Once | OROMUCOSAL | Status: AC
  Administered 2021-02-24: 15 mL via ORAL

## 2021-02-24 MED ORDER — PANTOPRAZOLE SODIUM 20 MG PO TBEC: 20.0000 mg | DELAYED_RELEASE_TABLET | Freq: Every day | ORAL | 0 refills | Status: DC

## 2021-02-24 MED ORDER — CIPROFLOXACIN HCL 500 MG PO TABS: 500.0000 mg | ORAL_TABLET | Freq: Two times a day (BID) | ORAL | 0 refills | Status: DC

## 2021-02-24 MED ORDER — IOHEXOL 300 MG/ML  SOLN
100.0000 mL | Freq: Once | INTRAMUSCULAR | Status: AC | PRN
  Administered 2021-02-24: 100 mL via INTRAVENOUS

## 2021-02-24 MED ORDER — METRONIDAZOLE 500 MG PO TABS: 500.0000 mg | ORAL_TABLET | Freq: Three times a day (TID) | ORAL | 0 refills | Status: AC

## 2021-02-24 MED ORDER — DICYCLOMINE HCL 20 MG PO TABS: 20.0000 mg | ORAL_TABLET | Freq: Three times a day (TID) | ORAL | 0 refills | Status: DC

## 2021-02-24 NOTE — ED Triage Notes (Signed)
Patient presents to Urgent Care with complaints of LUQ abdominal pain since about 5 days ago. Patient reports the pain is worse w/ movement, sometimes makes her nauseous. Has been taking tylenol and ibuprofen for the pain. Also had vomiting and diarrhea, but none for the past 2 days.

## 2021-02-24 NOTE — Discharge Instructions (Addendum)
Your CT did not show any acute abdominal process  I have sent in dicyclomine for you to take up to 4 times per day as needed for abdominal pain and cramping.  Will cover for diverticulitis given history.  Prescribed cipro, prescribed flagyl  Prescribed protonix as well  Follow up with this office or with primary care if symptoms are persisting.  Follow up in the ER for high fever, trouble swallowing, trouble breathing, other concerning symptoms.

## 2021-02-24 NOTE — ED Provider Notes (Signed)
[facility]   [id number] 02/24/21 Arrival Time: 1421  CC: ABDOMINAL PAIN  SUBJECTIVE:  Laurie [name] is a 45 y.o. female who presents with LUQ pain x 5 days. Reports decreased appetite, nausea, vomiting, sharp pain to LUQ. Denies a precipitating event, trauma, close contacts with similar symptoms, recent travel or antibiotic use. Reports abdominal cramping. Reports hx diverticulitis and states that this pain is worse. Has not taken OTC medications for this. Pain and nausea are aggravated by eating and drinking. Reports diarrhea, but not for the last 2 days. Denies fever, weight changes, chest pain, SOB, constipation, hematochezia, melena, dysuria, difficulty urinating, increased frequency or urgency, flank pain, loss of bowel or bladder function, vaginal discharge, vaginal odor, vaginal bleeding, dyspareunia, pelvic pain.     No LMP recorded. Patient has had a hysterectomy.  ROS: As per HPI.  All other pertinent ROS negative.     Past Medical History:  Diagnosis Date  . Endometriosis   . Flying phobia   . 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 History of colon polyps   . History of kidney stones 08/25/2013  . Meningitis    spinal  . Obesity   . Pancreatitis, acute 08/16/2015  . Spinal headache 12/02/2011   Past Surgical History:  Procedure Laterality Date  . ABDOMINAL HYSTERECTOMY    . bilateral tubal      x2  . CHOLECYSTECTOMY    . ECTOPIC PREGNANCY SURGERY    . KNEE ARTHROSCOPY Right   . TONSILLECTOMY    . TUBAL LIGATION     Allergies  Allergen Reactions  . Wellbutrin [Bupropion] Other (See Comments)    "did not feel like myself"   No current facility-administered medications on file prior to encounter.   Current Outpatient Medications on File Prior to Encounter  Medication Sig Dispense Refill  . albuterol (VENTOLIN HFA) 108 (90 Base) MCG/ACT inhaler Inhale 2 puffs into the lungs every 6 (six) hours as needed for wheezing. 18 g 0  . DULoxetine (CYMBALTA) 30 MG  capsule Take 1 capsule (30 mg total) by mouth daily. 30 capsule 3  . fexofenadine (ALLEGRA) 180 MG tablet Take 1 tablet (180 mg total) by mouth daily. 90 tablet 3  . valACYclovir (VALTREX) 1000 MG tablet Take 1 tablet (1,000 mg total) by mouth 2 (two) times daily. (Patient taking differently: Take 1,000 mg by mouth 2 (two) times daily as needed. ) 14 tablet 12   Social History   Socioeconomic History  . Marital status: Married    Spouse name: Not on file  . Number of children: Not on file  . Years of education: Not on file  . Highest education level: Not on file  Occupational History  . Not on file  Tobacco Use  . Smoking status: Former Smoker    Packs/day: 2.00    Years: 20.00    Pack years: 40.00    Types: Cigarettes    Quit date: 10/18/2007    Years since quitting: 13.3  . Smokeless tobacco: Never Used  Vaping Use  . Vaping Use: Never used  Substance and Sexual Activity  . Alcohol use: Yes    Alcohol/week: 12.0 - 16.0 standard drinks    Types: 12 - 16 Cans of beer per week    Comment: occ  . Drug use: No  . Sexual activity: Yes    Partners: Male    Birth control/protection: Surgical  Other Topics Concern  . Not on file  Social History Narrative  . Not on  file   Social Determinants of Health   Financial Resource Strain: Not on file  Food Insecurity: Not on file  Transportation Needs: Not on file  Physical Activity: Not on file  Stress: Not on file  Social Connections: Not on file  Intimate Partner Violence: Not on file   Family History  Problem Relation Age of Onset  . Thyroid disease Mother   . Allergic rhinitis Mother   . Cancer Paternal Grandfather        colon  . Colon cancer Paternal Grandfather   . Cancer Maternal Grandfather        colon  . Hyperlipidemia Maternal Grandfather   . Hypertension Maternal Grandfather   . Colon cancer Maternal Grandfather   . Throat cancer Maternal Grandfather   . Alcohol abuse Father   . Diabetes Father   .  Hyperlipidemia Maternal Grandmother   . Hypertension Maternal Grandmother   . Thyroid disease Maternal Grandmother   . Diabetes Paternal Grandmother   . Asthma Neg Hx   . Eczema Neg Hx   . Urticaria Neg Hx   . Immunodeficiency Neg Hx   . Angioedema Neg Hx      OBJECTIVE:  Vitals:   02/24/21 1431  BP: (!) 158/99  Pulse: 98  Resp: 18  Temp: 98.9 F (37.2 C)  TempSrc: Oral  SpO2: 99%    General appearance: Alert; NAD HEENT: NCAT. Oropharynx clear.  Lungs: clear to auscultation bilaterally without adventitious breath sounds Heart: regular rate and rhythm.  Radial pulses 2+ symmetrical bilaterally Abdomen: soft, non-distended; normal active bowel sounds; left sided mid and lower abdominal tenderness to light palpation, cannot tolerate deep palpation here; nontender at McBurney's point; negative Murphy's sign; negative rebound; no guarding Back: no CVA tenderness Extremities: no edema; symmetrical with no gross deformities Skin: warm and dry Neurologic: normal gait Psychological: alert and cooperative; normal mood and affect  LABS: Results for orders placed or performed during the hospital encounter of 02/24/21 (from the past 24 hour(s))  POCT urinalysis dipstick     Status: Abnormal   Collection Time: 02/24/21  2:59 PM  Result Value Ref Range   Color, UA yellow yellow   Clarity, UA clear clear   Glucose, UA negative negative mg/dL   Bilirubin, UA negative negative   Ketones, POC UA negative negative mg/dL   Spec Grav, UA 7.371 0.626 - 1.025   Blood, UA negative negative   pH, UA 6.5 5.0 - 8.0   Protein Ur, POC trace (A) negative mg/dL   Urobilinogen, UA 0.2 0.2 or 1.0 E.U./dL   Nitrite, UA Negative Negative   Leukocytes, UA Negative Negative    DIAGNOSTIC STUDIES: CT ABDOMEN PELVIS W CONTRAST  Result Date: 02/24/2021 CLINICAL DATA:  Nausea and vomiting, left upper quadrant abdominal pain for 5 days, fever and chills EXAM: CT ABDOMEN AND PELVIS WITH CONTRAST  TECHNIQUE: Multidetector CT imaging of the abdomen and pelvis was performed using the standard protocol following bolus administration of intravenous contrast. CONTRAST:  OMNIPAQUE IOHEXOL 300 MG/ML  SOLN COMPARISON:  04/08/2009 FINDINGS: Lower chest: No acute pleural or parenchymal lung disease. Hepatobiliary: Diffuse hepatic steatosis without focal liver abnormality. No biliary dilation. Gallbladder is surgically absent. Pancreas: Unremarkable. No pancreatic ductal dilatation or surrounding inflammatory changes. Spleen: Normal in size without focal abnormality. Adrenals/Urinary Tract: Adrenal glands are unremarkable. Kidneys are normal, without renal calculi, focal lesion, or hydronephrosis. Bladder is unremarkable. Stomach/Bowel: No bowel obstruction or ileus. Normal appendix right lower quadrant. No bowel wall thickening  or inflammatory change. Vascular/Lymphatic: No significant vascular findings are present. No enlarged abdominal or pelvic lymph nodes. Reproductive: Status post hysterectomy. No adnexal masses. Other: No free fluid or free gas. No abdominal wall hernia. Musculoskeletal: No acute or destructive bony lesions. Reconstructed images demonstrate no additional findings. IMPRESSION: 1. No acute intra-abdominal or intrapelvic process. 2. Diffuse hepatic steatosis without focal liver abnormality. Electronically Signed   By: Sharlet Salina M.D.   On: 02/24/2021 15:44     ASSESSMENT & PLAN:  1. Acute LUQ pain   2. Nausea and vomiting, intractability of vomiting not specified, unspecified vomiting type   3. Hepatic steatosis     Meds ordered this encounter  Medications  . AND Linked Order Group   . alum & mag hydroxide-simeth (MAALOX/MYLANTA) 200-200-20 MG/5ML suspension 30 mL   . lidocaine (XYLOCAINE) 2 % viscous mouth solution 15 mL  . ciprofloxacin (CIPRO) 500 MG tablet    Sig: Take 1 tablet (500 mg total) by mouth 2 (two) times daily.    Dispense:  14 tablet    Refill:  0     Order Specific Question:   Supervising Provider    Answer:   Merrilee Jansky [id number]  . metroNIDAZOLE (FLAGYL) 500 MG tablet    Sig: Take 1 tablet (500 mg total) by mouth 3 (three) times daily for 7 days.    Dispense:  21 tablet    Refill:  0    Order Specific Question:   Supervising Provider    Answer:   Merrilee Jansky [id number]  . pantoprazole (PROTONIX) 20 MG tablet    Sig: Take 1 tablet (20 mg total) by mouth daily.    Dispense:  30 tablet    Refill:  0    Order Specific Question:   Supervising Provider    Answer:   Merrilee Jansky [id number]  . dicyclomine (BENTYL) 20 MG tablet    Sig: Take 1 tablet (20 mg total) by mouth 4 (four) times daily -  before meals and at bedtime for 5 days.    Dispense:  20 tablet    Refill:  0    Order Specific Question:   Supervising Provider    Answer:   Merrilee Jansky [id number]    Prescribed flagyl Prescribed cipro Prescribed protonix Prescribed dicyclomine Will cover for diverticulitis given history CT shows hepatic steatosis Get rest and drink plenty of fluids    DIET Instructions:  30 minutes after taking nausea medicine, begin with sips of clear liquids. If able to hold down 2 - 4 ounces for 30 minutes, begin drinking more. Increase your fluid intake to replace losses. Clear liquids only for 24 hours (water, tea, sport drinks, clear flat ginger ale or cola and juices, broth, jello, popsicles, ect). Advance to bland foods, applesauce, rice, baked or boiled chicken, ect. Avoid milk, greasy foods and anything that doesn't agree with you.  If you experience new or worsening symptoms return or go to ER such as fever, chills, nausea, vomiting, diarrhea, bloody or dark tarry stools, constipation, urinary symptoms, worsening abdominal discomfort, symptoms that do not improve with medications, inability to keep fluids down.  Reviewed expectations re: course of current medical issues. Questions answered. Outlined signs and symptoms  indicating need for more acute intervention. Patient verbalized understanding. After Visit Summary given.   Moshe Cipro, NP 02/24/21 [phone]

## 2021-03-13 NOTE — Telephone Encounter (Signed)
This product is not covered under the patient's plan and patient may be eligible for [employer] plan they handle all of that at [facility]. - CF

## 2021-03-20 ENCOUNTER — Other Ambulatory Visit: Payer: Self-pay | Admitting: Sports Medicine

## 2021-03-20 DIAGNOSIS — M7918 Myalgia, other site: Secondary | ICD-10-CM

## 2021-04-17 ENCOUNTER — Ambulatory Visit: Payer: BC Managed Care – PPO | Admitting: Medical-Surgical

## 2021-04-17 ENCOUNTER — Encounter: Payer: Self-pay | Admitting: Medical-Surgical

## 2021-04-17 ENCOUNTER — Ambulatory Visit (INDEPENDENT_AMBULATORY_CARE_PROVIDER_SITE_OTHER): Payer: BC Managed Care – PPO

## 2021-04-17 ENCOUNTER — Other Ambulatory Visit: Payer: Self-pay

## 2021-04-17 VITALS — BP 137/93 | HR 90 | Temp 98.5°F | Ht 69.0 in | Wt 277.4 lb

## 2021-04-17 DIAGNOSIS — R002 Palpitations: Secondary | ICD-10-CM

## 2021-04-17 DIAGNOSIS — Z0000 Encounter for general adult medical examination without abnormal findings: Secondary | ICD-10-CM | POA: Diagnosis not present

## 2021-04-17 DIAGNOSIS — R21 Rash and other nonspecific skin eruption: Secondary | ICD-10-CM

## 2021-04-17 DIAGNOSIS — R079 Chest pain, unspecified: Secondary | ICD-10-CM

## 2021-04-17 DIAGNOSIS — F418 Other specified anxiety disorders: Secondary | ICD-10-CM

## 2021-04-17 DIAGNOSIS — Z6841 Body Mass Index (BMI) 40.0 and over, adult: Secondary | ICD-10-CM

## 2021-04-17 MED ORDER — TERBINAFINE HCL 1 % EX CREA: 1.0000 "application " | TOPICAL_CREAM | Freq: Two times a day (BID) | CUTANEOUS | 0 refills | Status: DC

## 2021-04-17 NOTE — Progress Notes (Signed)
Subjective:    CC: chest pain, foot issue, mood  HPI: Pleasant 45 year old female presenting for the following:  Chest pain- has been having intermittent chest pain that is described as a squeezing sensation accompanied by nausea, shortness of breath, and diaphoresis. Can last for minutes or hours. Not associated with any specific activity. Can climb several flights of stairs without symptoms. Intermittent palpitations that she has noted and sees her smart watch catch them too. At times, has had neck tightness bilaterally with a feeling like she can't breathe. Had an episode of this last month and now reports feeling drained all the time.   Mood- under quite a bit of stress between work, family issues, settling two estates, and frequent travel for her job. She is doing some counseling. Takes Cymbalta for fibromyalgia but only manages to take it about every other day because of forgotten doses and worrying that it's too late to take it. Denies SI/HI.   Foot problem- about 3 weeks noted her left foot was swollen. She had a blister develop on the bottom of her foot. The blister popped and drained clear fluid. After that, she developed new blisters on the ball of the foot. Notes that after that, the skin started to peel off and it looked like it was rotting. One of her toes turned purple. She does walk barefoot in the field and at home a lot. Started wearing a sock at night. Threw the shoes that she had been wearing away. Has tried neosporin, an OTC antifungal, epsom salt soaks, and even bleach soaks. The area will heal some then recur again. Looks better now and has been like this for 2 days.   I reviewed the past medical history, family history, social history, surgical history, and allergies today and no changes were needed.  Please see the problem list section below in epic for further details.  Past Medical History: Past Medical History:  Diagnosis Date  . Endometriosis   . Flying phobia   .  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 History of colon polyps   . History of kidney stones 08/25/2013  . Meningitis    spinal  . Obesity   . Pancreatitis, acute 08/16/2015  . Spinal headache 12/02/2011   Past Surgical History: Past Surgical History:  Procedure Laterality Date  . ABDOMINAL HYSTERECTOMY    . bilateral tubal      x2  . CHOLECYSTECTOMY    . ECTOPIC PREGNANCY SURGERY    . KNEE ARTHROSCOPY Right   . TONSILLECTOMY    . TUBAL LIGATION     Social History: Social History   Socioeconomic History  . Marital status: Married    Spouse name: Not on file  . Number of children: Not on file  . Years of education: Not on file  . Highest education level: Not on file  Occupational History  . Not on file  Tobacco Use  . Smoking status: Former Smoker    Packs/day: 2.00    Years: 20.00    Pack years: 40.00    Types: Cigarettes    Quit date: 10/18/2007    Years since quitting: 13.5  . Smokeless tobacco: Never Used  Vaping Use  . Vaping Use: Never used  Substance and Sexual Activity  . Alcohol use: Yes    Alcohol/week: 12.0 - 16.0 standard drinks    Types: 12 - 16 Cans of beer per week    Comment: occ  . Drug use: No  . Sexual activity: Yes  Partners: Male    Birth control/protection: Surgical  Other Topics Concern  . Not on file  Social History Narrative  . Not on file   Social Determinants of Health   Financial Resource Strain: Not on file  Food Insecurity: Not on file  Transportation Needs: Not on file  Physical Activity: Not on file  Stress: Not on file  Social Connections: Not on file   Family History: Family History  Problem Relation Age of Onset  . Thyroid disease Mother   . Allergic rhinitis Mother   . Cancer Paternal Grandfather        colon  . Colon cancer Paternal Grandfather   . Cancer Maternal Grandfather        colon  . Hyperlipidemia Maternal Grandfather   . Hypertension Maternal Grandfather   . Colon cancer Maternal Grandfather   . Throat  cancer Maternal Grandfather   . Alcohol abuse Father   . Diabetes Father   . Hyperlipidemia Maternal Grandmother   . Hypertension Maternal Grandmother   . Thyroid disease Maternal Grandmother   . Diabetes Paternal Grandmother   . Asthma Neg Hx   . Eczema Neg Hx   . Urticaria Neg Hx   . Immunodeficiency Neg Hx   . Angioedema Neg Hx    Allergies: Allergies  Allergen Reactions  . Wellbutrin [Bupropion] Other (See Comments)    "did not feel like myself"   Medications: See med rec.  Review of Systems: See HPI for pertinent positives and negatives.   Objective:    General: Well Developed, well nourished, and in no acute distress.  Neuro: Alert and oriented x3.  HEENT: Normocephalic, atraumatic.  Skin: Warm and dry. See clinical photo. Cardiac: Regular rate and rhythm, no murmurs rubs or gallops, no lower extremity edema.  Respiratory: Clear to auscultation bilaterally. Not using accessory muscles, speaking in full sentences.    Impression and Recommendations:    1. Palpitations 2. Chest pain, unspecified type In office EKG completed with normal rate of 81, normal axis, and normal rhythm. Will obtain a long term monitor for a better idea of events. Checking labs. Suspect anxiety and stress is playing a role here but plan to rule out cardiac causes.  - LONG TERM MONITOR (3-14 DAYS); Future - EKG 12-Lead - COMPLETE METABOLIC PANEL WITH GFR - Thyroid Panel With TSH  3. Rash of foot She is scared that she is diabetic so we will check a hemoglobin A1c. Rash looks fungal so start terbinafine cream 1% topically BID. Advised that it may take 2+ weeks to clear completely. Avoid being barefoot if possible. Wear white cotton socks to protect skin.  - Hemoglobin A1c  4. Preventative health care Checking CBC w/diff, CMP, and lipid panel.  - CBC with Differential/Platelet - COMPLETE METABOLIC PANEL WITH GFR - Lipid panel   5. Class 3 severe obesity due to excess calories with  serious comorbidity and body mass index (BMI) of 40.0 to 44.9 in adult [facility]) Checking hemoglobin A1c. - Hemoglobin A1c  6. Depression with anxiety Recommend aiming to take Cymbalta every day instead of missing doses. This will likely help with mood, myofascial pain, and fatigue. Continue counseling. Discussed that a lot of her stress is related to life events and situations and while medication can help, she will need to work on making some changes. Setting priorities and working in short bursts might help.   Return in about 3 weeks (around 05/08/2021) for chest pain/mood/foot rash follow up. ___________________________________________ Thayer Ohm,  DNP, APRN, FNP-BC Primary Care and [place]

## 2021-04-18 ENCOUNTER — Encounter: Payer: Self-pay | Admitting: Medical-Surgical

## 2021-04-18 DIAGNOSIS — E782 Mixed hyperlipidemia: Secondary | ICD-10-CM

## 2021-04-18 LAB — HEMOGLOBIN A1C
Hgb A1c MFr Bld: 5.1 % of total Hgb (ref <5.7)
Mean Plasma Glucose: 100 mg/dL
eAG (mmol/L): 5.5 mmol/L

## 2021-04-18 LAB — CBC WITH DIFFERENTIAL/PLATELET
Absolute Monocytes: 512 cells/uL (ref 200–950)
Basophils Absolute: 39 cells/uL (ref 0–200)
Basophils Relative: 0.7 %
Eosinophils Absolute: 110 cells/uL (ref 15–500)
Eosinophils Relative: 2 %
HCT: 46.3 % — ABNORMAL HIGH (ref 35.0–45.0)
Hemoglobin: 15.8 g/dL — ABNORMAL HIGH (ref 11.7–15.5)
Lymphs Abs: 1887 cells/uL (ref 850–3900)
MCH: 31.7 pg (ref 27.0–33.0)
MCHC: 34.1 g/dL (ref 32.0–36.0)
MCV: 92.8 fL (ref 80.0–100.0)
MPV: 10.9 fL (ref 7.5–12.5)
Monocytes Relative: 9.3 %
Neutro Abs: 2954 cells/uL (ref 1500–7800)
Neutrophils Relative %: 53.7 %
Platelets: 229 10*3/uL (ref 140–400)
RBC: 4.99 10*6/uL (ref 3.80–5.10)
RDW: 12.5 % (ref 11.0–15.0)
Total Lymphocyte: 34.3 %
WBC: 5.5 10*3/uL (ref 3.8–10.8)

## 2021-04-18 LAB — LIPID PANEL
Cholesterol: 285 mg/dL — ABNORMAL HIGH (ref <200)
HDL: 54 mg/dL (ref >=50)
LDL Cholesterol (Calc): 185 mg/dL (calc) — ABNORMAL HIGH
Non-HDL Cholesterol (Calc): 231 mg/dL (calc) — ABNORMAL HIGH (ref <130)
Total CHOL/HDL Ratio: 5.3 (calc) — ABNORMAL HIGH (ref <5.0)
Triglycerides: 246 mg/dL — ABNORMAL HIGH (ref <150)

## 2021-04-18 LAB — COMPLETE METABOLIC PANEL WITH GFR
AG Ratio: 1.7 (calc) (ref 1.0–2.5)
ALT: 50 U/L — ABNORMAL HIGH (ref 6–29)
AST: 35 U/L — ABNORMAL HIGH (ref 10–30)
Albumin: 4.6 g/dL (ref 3.6–5.1)
Alkaline phosphatase (APISO): 86 U/L (ref 31–125)
BUN: 12 mg/dL (ref 7–25)
CO2: 29 mmol/L (ref 20–32)
Calcium: 9.7 mg/dL (ref 8.6–10.2)
Chloride: 102 mmol/L (ref 98–110)
Creat: 0.85 mg/dL (ref 0.50–1.10)
GFR, Est African American: 97 mL/min/{1.73_m2} (ref >=60)
GFR, Est Non African American: 84 mL/min/{1.73_m2} (ref >=60)
Globulin: 2.7 g/dL (calc) (ref 1.9–3.7)
Glucose, Bld: 92 mg/dL (ref 65–139)
Potassium: 4.1 mmol/L (ref 3.5–5.3)
Sodium: 139 mmol/L (ref 135–146)
Total Bilirubin: 0.9 mg/dL (ref 0.2–1.2)
Total Protein: 7.3 g/dL (ref 6.1–8.1)

## 2021-04-18 LAB — THYROID PANEL WITH TSH
Free Thyroxine Index: 2.5 (ref 1.4–3.8)
T3 Uptake: 31 % (ref 22–35)
T4, Total: 8.1 ug/dL (ref 5.1–11.9)
TSH: 2.71 mIU/L

## 2021-04-18 MED ORDER — ATORVASTATIN CALCIUM 20 MG PO TABS
20.0000 mg | ORAL_TABLET | Freq: Every day | ORAL | 3 refills | Status: DC
Start: 2021-04-18 — End: 2022-04-01

## 2021-05-05 DIAGNOSIS — R002 Palpitations: Secondary | ICD-10-CM | POA: Diagnosis not present

## 2021-05-05 DIAGNOSIS — R079 Chest pain, unspecified: Secondary | ICD-10-CM | POA: Diagnosis not present

## 2021-05-07 ENCOUNTER — Ambulatory Visit: Payer: BC Managed Care – PPO | Admitting: Medical-Surgical

## 2021-05-28 DIAGNOSIS — Z20822 Contact with and (suspected) exposure to covid-19: Secondary | ICD-10-CM | POA: Diagnosis not present

## 2021-08-19 DIAGNOSIS — K760 Fatty (change of) liver, not elsewhere classified: Secondary | ICD-10-CM | POA: Diagnosis not present

## 2021-08-19 DIAGNOSIS — R0602 Shortness of breath: Secondary | ICD-10-CM | POA: Diagnosis not present

## 2021-08-19 DIAGNOSIS — R7989 Other specified abnormal findings of blood chemistry: Secondary | ICD-10-CM | POA: Diagnosis not present

## 2021-08-19 DIAGNOSIS — Z634 Disappearance and death of family member: Secondary | ICD-10-CM | POA: Diagnosis not present

## 2021-08-19 DIAGNOSIS — R079 Chest pain, unspecified: Secondary | ICD-10-CM | POA: Diagnosis not present

## 2021-08-19 DIAGNOSIS — K219 Gastro-esophageal reflux disease without esophagitis: Secondary | ICD-10-CM | POA: Diagnosis not present

## 2021-08-19 DIAGNOSIS — R112 Nausea with vomiting, unspecified: Secondary | ICD-10-CM | POA: Diagnosis not present

## 2021-08-19 DIAGNOSIS — R918 Other nonspecific abnormal finding of lung field: Secondary | ICD-10-CM | POA: Diagnosis not present

## 2021-08-19 DIAGNOSIS — R1013 Epigastric pain: Secondary | ICD-10-CM | POA: Diagnosis not present

## 2021-08-19 DIAGNOSIS — R197 Diarrhea, unspecified: Secondary | ICD-10-CM | POA: Diagnosis not present

## 2021-08-19 DIAGNOSIS — Z87891 Personal history of nicotine dependence: Secondary | ICD-10-CM | POA: Diagnosis not present

## 2021-08-19 DIAGNOSIS — Z20822 Contact with and (suspected) exposure to covid-19: Secondary | ICD-10-CM | POA: Diagnosis not present

## 2021-08-19 DIAGNOSIS — Z79899 Other long term (current) drug therapy: Secondary | ICD-10-CM | POA: Diagnosis not present

## 2021-08-19 DIAGNOSIS — R109 Unspecified abdominal pain: Secondary | ICD-10-CM | POA: Diagnosis not present

## 2021-08-20 ENCOUNTER — Telehealth: Payer: Self-pay | Admitting: General Practice

## 2021-08-20 NOTE — Telephone Encounter (Signed)
Transition Care Management Unsuccessful Follow-up Telephone Call  Date of discharge and from where:  08/19/21 from [facility]  Attempts:  1st Attempt  Reason for unsuccessful TCM follow-up call:  Left voice message    

## 2021-08-22 NOTE — Telephone Encounter (Signed)
Transition Care Management Unsuccessful Follow-up Telephone Call  Date of discharge and from where:  08/19/21 from [facility]  Attempts:  2nd Attempt  Reason for unsuccessful TCM follow-up call:  Left voice message

## 2021-08-25 NOTE — Telephone Encounter (Signed)
Transition Care Management Unsuccessful Follow-up Telephone Call  Date of discharge and from where:  08/19/21 from [facility]  Attempts:  3rd Attempt  Reason for unsuccessful TCM follow-up call:  Left voice message    

## 2021-12-18 ENCOUNTER — Ambulatory Visit: Payer: BC Managed Care – PPO | Admitting: Obstetrics & Gynecology

## 2021-12-18 ENCOUNTER — Other Ambulatory Visit (HOSPITAL_COMMUNITY)
Admission: RE | Admit: 2021-12-18 | Discharge: 2021-12-18 | Disposition: A | Discharge status: Home / Self Care | Payer: BC Managed Care – PPO | Source: Ambulatory Visit | Attending: Obstetrics & Gynecology | Admitting: Obstetrics & Gynecology

## 2021-12-18 ENCOUNTER — Encounter: Payer: Self-pay | Admitting: Obstetrics & Gynecology

## 2021-12-18 ENCOUNTER — Other Ambulatory Visit: Payer: Self-pay

## 2021-12-18 VITALS — BP 141/100 | HR 82 | Ht 69.0 in | Wt 281.0 lb

## 2021-12-18 DIAGNOSIS — R102 Pelvic and perineal pain: Secondary | ICD-10-CM

## 2021-12-18 DIAGNOSIS — N898 Other specified noninflammatory disorders of vagina: Secondary | ICD-10-CM | POA: Insufficient documentation

## 2021-12-18 DIAGNOSIS — N9489 Other specified conditions associated with female genital organs and menstrual cycle: Secondary | ICD-10-CM | POA: Diagnosis not present

## 2021-12-18 LAB — POCT URINALYSIS DIPSTICK
Bilirubin, UA: NEGATIVE
Blood, UA: NEGATIVE
Glucose, UA: NEGATIVE
Ketones, UA: NEGATIVE
Leukocytes, UA: NEGATIVE
Nitrite, UA: NEGATIVE
Protein, UA: NEGATIVE
Spec Grav, UA: 1.015 (ref 1.010–1.025)
Urobilinogen, UA: 0.2 E.U./dL
pH, UA: 5 (ref 5.0–8.0)

## 2021-12-18 NOTE — Progress Notes (Signed)
Spotting stopped but, had been sporadically spotting for three weeks prior. Pelvic pressure but denies dysuria. Fatigue for three weeks.  BP: 141/100 Pt states BP isn't normally very elevated but, she had a stressful work meeting and is nervous for appt

## 2021-12-18 NOTE — Progress Notes (Signed)
° °  Subjective:    Patient ID: Laurie [name], female    DOB: Mar 22, 1976, 45 y.o.   MRN: [id number]  HPI  Patient presents with complaints of vaginal spotting the past few weeks.  Patient has a history of hysterectomy for bad endometriosis.  The cervix was taken.  In review of notes, this also happened in 2016.  Patient states she also has some bleeding after intercourse.  Intercourse is sometimes painful and sometimes its not.  It can be painful on insertion and deep penetration.  Patient is sure the vaginal bleeding is coming from her vagina.  Review of Systems  Genitourinary:  Positive for dyspareunia and vaginal bleeding. Negative for vaginal discharge.      Objective:   Physical Exam Vitals reviewed.  Constitutional:      General: She is not in acute distress.    Appearance: She is well-developed.  HENT:     Head: Normocephalic and atraumatic.  Eyes:     Conjunctiva/sclera: Conjunctivae normal.  Cardiovascular:     Rate and Rhythm: Normal rate.  Pulmonary:     Effort: Pulmonary effort is normal.  Genitourinary:    Comments: Tanner V Vulva:  No lesion Vagina:  Pink, no lesions, no blood; cuff is visualized and intact.  There are no excoriations.  There is no evidence of old trauma.  There is some discharge which was sent off for vaginitis testing. Skin:    General: Skin is warm and dry.  Neurological:     Mental Status: She is alert and oriented to person, place, and time.  Psychiatric:        Mood and Affect: Mood normal.   Vitals:   12/18/21 1306  BP: (!) 141/100  Pulse: 82  Weight: 281 lb (127.5 kg)  Height: 5\' 9"  (1.753 m)           Assessment & Plan:  45 year old female with vaginal bleeding and bleeding after intercourse with unknown etiology Cultures of the vagina did collected today Patient appears to have pelvic floor dysfunction.  The postcoital bleeding could be to increase pelvic tension.  Patient referred to physical therapy. Patient will call  the next time bleeding occurs.  The last episode of bleeding was approximately 9 days ago.  We will try to get her in same day when the bleeding occurs next.  30 minutes were spent in this encounter with review of records, history and physical, counseling, and documentation.

## 2021-12-19 LAB — CERVICOVAGINAL ANCILLARY ONLY
Bacterial Vaginitis (gardnerella): NEGATIVE
Candida Glabrata: NEGATIVE
Candida Vaginitis: NEGATIVE
Chlamydia: NEGATIVE
Comment: NEGATIVE
Comment: NEGATIVE
Comment: NEGATIVE
Comment: NEGATIVE
Comment: NEGATIVE
Comment: NORMAL
Neisseria Gonorrhea: NEGATIVE
Trichomonas: NEGATIVE

## 2021-12-20 LAB — URINE CULTURE
MICRO NUMBER:: 12792987
SPECIMEN QUALITY:: ADEQUATE

## 2022-02-06 DIAGNOSIS — Z0000 Encounter for general adult medical examination without abnormal findings: Secondary | ICD-10-CM | POA: Diagnosis not present

## 2022-02-06 DIAGNOSIS — E8881 Metabolic syndrome: Secondary | ICD-10-CM | POA: Diagnosis not present

## 2022-02-06 DIAGNOSIS — R635 Abnormal weight gain: Secondary | ICD-10-CM | POA: Diagnosis not present

## 2022-02-06 DIAGNOSIS — Z6841 Body Mass Index (BMI) 40.0 and over, adult: Secondary | ICD-10-CM | POA: Diagnosis not present

## 2022-02-06 DIAGNOSIS — Z1231 Encounter for screening mammogram for malignant neoplasm of breast: Secondary | ICD-10-CM | POA: Diagnosis not present

## 2022-02-06 DIAGNOSIS — F902 Attention-deficit hyperactivity disorder, combined type: Secondary | ICD-10-CM | POA: Diagnosis not present

## 2022-02-06 DIAGNOSIS — Z8739 Personal history of other diseases of the musculoskeletal system and connective tissue: Secondary | ICD-10-CM | POA: Diagnosis not present

## 2022-02-06 DIAGNOSIS — I10 Essential (primary) hypertension: Secondary | ICD-10-CM | POA: Diagnosis not present

## 2022-03-24 DIAGNOSIS — Z1231 Encounter for screening mammogram for malignant neoplasm of breast: Secondary | ICD-10-CM | POA: Diagnosis not present

## 2022-04-01 ENCOUNTER — Emergency Department (INDEPENDENT_AMBULATORY_CARE_PROVIDER_SITE_OTHER)
Admission: RE | Admit: 2022-04-01 | Discharge: 2022-04-01 | Disposition: A | Discharge status: Home / Self Care | Payer: BC Managed Care – PPO | Source: Ambulatory Visit | Attending: Family Medicine | Admitting: Family Medicine

## 2022-04-01 VITALS — BP 144/91 | HR 95 | Temp 98.8°F | Resp 14

## 2022-04-01 DIAGNOSIS — J019 Acute sinusitis, unspecified: Secondary | ICD-10-CM | POA: Diagnosis not present

## 2022-04-01 DIAGNOSIS — B9689 Other specified bacterial agents as the cause of diseases classified elsewhere: Secondary | ICD-10-CM | POA: Diagnosis not present

## 2022-04-01 MED ORDER — AMOXICILLIN-POT CLAVULANATE 875-125 MG PO TABS: 1.0000 | ORAL_TABLET | Freq: Two times a day (BID) | ORAL | 0 refills | Status: AC

## 2022-04-01 MED ORDER — PREDNISONE 20 MG PO TABS: 20.0000 mg | ORAL_TABLET | Freq: Two times a day (BID) | ORAL | 0 refills | Status: AC

## 2022-04-01 NOTE — ED Provider Notes (Signed)
?[facility] ? ? ? ?CSN: [id number] ?Arrival date & time: 04/01/22  1344 ? ? ?  ? ?History   ?Chief Complaint ?Chief Complaint  ?Patient presents with  ? Cough  ?  Fever, sore throat and coughing up yellow and green mucus - Entered by patient  ? Sore Throat  ? Headache  ? ? ?HPI ?Laurie [name] is a 46 y.o. female.  ? ?HPI ? ?Patient has had symptoms for 1 week.  She states her symptoms are worsening.  She has developed a purulent sputum that smells bad and taste bad to her.  Initially deep yellow and green.  No blood streaks.  She states that she also has some sinus congestion, postnasal drip, sore throat.  Sore throat is gotten better.  No fever or chills.  No headache or body ache.  No exposure to COVID ? ?Past Medical History:  ?Diagnosis Date  ? Endometriosis   ? Flying phobia   ? Genital HSV 10/17/2018  ? History of colon polyps   ? History of kidney stones 08/25/2013  ? Meningitis   ? spinal  ? Obesity   ? Pancreatitis, acute 08/16/2015  ? Spinal headache 12/02/2011  ? ? ?Patient Active Problem List  ? Diagnosis Date Noted  ? Myofascial pain syndrome 11/05/2020  ? Grieving 11/05/2020  ? Hypertension goal BP (blood pressure) < 130/80 04/17/2019  ? Family history of colorectal cancer 04/17/2019  ? History of cold urticaria 01/31/2019  ? Allergic urticaria 01/31/2019  ? Other allergic rhinitis 01/31/2019  ? History of rheumatoid arthritis 01/31/2019  ? Morbid obesity with BMI of 40.0-44.9, adult (HCC) 01/31/2019  ? Anaphylactic reaction due to adverse effect of correct drug or medicament properly administered, initial encounter 01/16/2019  ? Mixed hyperlipidemia 10/18/2018  ? Adult ADHD 10/17/2018  ? History of hysterectomy for benign disease 10/17/2018  ? Genital HSV 10/17/2018  ? History of colon polyps   ? Flying phobia   ? Right knee pain 01/28/2018  ? Back pain 05/13/2017  ? Primary osteoarthritis of both knees 03/11/2015  ? Poor concentration 10/30/2014  ? Breast lump 05/29/2014  ? OSA  (obstructive sleep apnea) 02/02/2014  ? Insulin resistance 11/09/2013  ? History of kidney stones 08/25/2013  ? Chronic constipation 08/04/2013  ? ? ?Past Surgical History:  ?Procedure Laterality Date  ? ABDOMINAL HYSTERECTOMY    ? bilateral tubal     ? x2  ? CHOLECYSTECTOMY    ? ECTOPIC PREGNANCY SURGERY    ? KNEE ARTHROSCOPY Right   ? TONSILLECTOMY    ? TUBAL LIGATION    ? ? ?OB History   ? ? Gravida  ?2  ? Para  ?1  ? Term  ?   ? Preterm  ?   ? AB  ?1  ? Living  ?2  ?  ? ? SAB  ?   ? IAB  ?   ? Ectopic  ?1  ? Multiple  ?   ? Live Births  ?   ?   ?  ?  ? ? ? ?Home Medications   ? ?Prior to Admission medications   ?Medication Sig Start Date End Date Taking? Authorizing Provider  ?fexofenadine (ALLEGRA) 180 MG tablet Take 1 tablet (180 mg total) by mouth daily. ?Patient taking differently: Take 180 mg by mouth daily as needed. 04/17/20   Monica Becton, MD  ?valACYclovir (VALTREX) 1000 MG tablet Take 1 tablet (1,000 mg total) by mouth 2 (two) times daily. ?Patient taking  differently: Take 1,000 mg by mouth 2 (two) times daily as needed. 01/30/15   Allie Bossierove, Myra C, MD  ? ? ?Family History ?Family History  ?Problem Relation Age of Onset  ? Thyroid disease Mother   ? Allergic rhinitis Mother   ? Cancer Paternal Grandfather   ?     colon  ? Colon cancer Paternal Grandfather   ? Cancer Maternal Grandfather   ?     colon  ? Hyperlipidemia Maternal Grandfather   ? Hypertension Maternal Grandfather   ? Colon cancer Maternal Grandfather   ? Throat cancer Maternal Grandfather   ? Alcohol abuse Father   ? Diabetes Father   ? Hyperlipidemia Maternal Grandmother   ? Hypertension Maternal Grandmother   ? Thyroid disease Maternal Grandmother   ? Diabetes Paternal Grandmother   ? Asthma Neg Hx   ? Eczema Neg Hx   ? Urticaria Neg Hx   ? Immunodeficiency Neg Hx   ? Angioedema Neg Hx   ? ? ?Social History ?Social History  ? ?Tobacco Use  ? Smoking status: Former  ?  Packs/day: 2.00  ?  Years: 20.00  ?  Pack years: 40.00  ?  Types:  Cigarettes  ?  Quit date: 10/18/2007  ?  Years since quitting: 14.4  ? Smokeless tobacco: Never  ?Vaping Use  ? Vaping Use: Never used  ?Substance Use Topics  ? Alcohol use: Yes  ?  Alcohol/week: 12.0 - 16.0 standard drinks  ?  Types: 12 - 16 Cans of beer per week  ?  Comment: occ  ? Drug use: No  ? ? ? ?Allergies   ?Wellbutrin [bupropion] ? ? ?Review of Systems ?Review of Systems ?See HPI ? ?Physical Exam ?Triage Vital Signs ?ED Triage Vitals  ?Enc Vitals Group  ?   BP 04/01/22 1350 (!) 144/91  ?   Pulse Rate 04/01/22 1350 95  ?   Resp 04/01/22 1350 14  ?   Temp 04/01/22 1350 98.8 ?F (37.1 ?C)  ?   Temp Source 04/01/22 1350 Oral  ?   SpO2 04/01/22 1350 97 %  ?   Weight --   ?   Height --   ?   Head Circumference --   ?   Peak Flow --   ?   Pain Score 04/01/22 1353 0  ?   Pain Loc --   ?   Pain Edu? --   ?   Excl. in GC? --   ? ?No data found. ? ?Updated Vital Signs ?BP (!) 144/91 (BP Location: Right Arm)   Pulse 95   Temp 98.8 ?F (37.1 ?C) (Oral)   Resp 14   SpO2 97%  ?   ? ?Physical Exam ?Constitutional:   ?   General: She is not in acute distress. ?   Appearance: She is well-developed. She is obese. She is ill-appearing.  ?HENT:  ?   Head: Normocephalic and atraumatic.  ?   Left Ear: Tympanic membrane and ear canal normal.  ?   Nose: Congestion and rhinorrhea present.  ?   Mouth/Throat:  ?   Mouth: Mucous membranes are moist.  ?   Pharynx: Posterior oropharyngeal erythema present.  ?   Tonsils: No tonsillar exudate. 0 on the right. 0 on the left.  ?Eyes:  ?   Conjunctiva/sclera: Conjunctivae normal.  ?   Pupils: Pupils are equal, round, and reactive to light.  ?Cardiovascular:  ?   Rate and Rhythm: Normal rate and regular rhythm.  ?  Heart sounds: Normal heart sounds.  ?Pulmonary:  ?   Effort: Pulmonary effort is normal. No respiratory distress.  ?   Breath sounds: Normal breath sounds.  ?Abdominal:  ?   General: There is no distension.  ?   Palpations: Abdomen is soft.  ?Musculoskeletal:     ?   General:  Normal range of motion.  ?   Cervical back: Normal range of motion.  ?Lymphadenopathy:  ?   Cervical: Cervical adenopathy present.  ?Skin: ?   General: Skin is warm and dry.  ?Neurological:  ?   Mental Status: She is alert.  ?Psychiatric:     ?   Mood and Affect: Mood normal.     ?   Behavior: Behavior normal.  ? ? ? ?UC Treatments / Results  ?Labs ?(all labs ordered are listed, but only abnormal results are displayed) ?Labs Reviewed - No data to display ? ?EKG ? ? ?Radiology ?No results found. ? ?Procedures ?Procedures (including critical care time) ? ?Medications Ordered in UC ?Medications - No data to display ? ?Initial Impression / Assessment and Plan / UC Course  ?I have reviewed the triage vital signs and the nursing notes. ? ?Pertinent labs & imaging results that were available during my care of the patient were reviewed by me and considered in my medical decision making (see chart for details). ? ?  ? ?Final Clinical Impressions(s) / UC Diagnoses  ? ?Final diagnoses:  ?Acute bacterial sinusitis  ? ? ? ?Discharge Instructions   ? ?  ?Drink lots of fluid ?May continue over-the-counter cough and cold medicine as needed ?Take the Augmentin antibiotic 2 times a day ?Take prednisone 2 times a day for the first 5 days. ?Call your doctor if not improving by next week ? ? ?ED Prescriptions   ?None ?  ? ?PDMP not reviewed this encounter. ?  ?Eustace Moore, MD ?04/01/22 1416 ? ?

## 2022-04-01 NOTE — Discharge Instructions (Signed)
Drink lots of fluid ?May continue over-the-counter cough and cold medicine as needed ?Take the Augmentin antibiotic 2 times a day ?Take prednisone 2 times a day for the first 5 days. ?Call your doctor if not improving by next week ?

## 2022-04-01 NOTE — ED Triage Notes (Signed)
Pt presents with cough, fever, and congestion that began Sunday. Pt states last Thursday she had a sore throat that has since improved. ?

## 2022-07-14 DIAGNOSIS — Z1339 Encounter for screening examination for other mental health and behavioral disorders: Secondary | ICD-10-CM | POA: Diagnosis not present

## 2022-07-14 DIAGNOSIS — F900 Attention-deficit hyperactivity disorder, predominantly inattentive type: Secondary | ICD-10-CM | POA: Diagnosis not present

## 2022-07-16 DIAGNOSIS — I10 Essential (primary) hypertension: Secondary | ICD-10-CM | POA: Diagnosis not present

## 2022-07-16 DIAGNOSIS — M797 Fibromyalgia: Secondary | ICD-10-CM | POA: Diagnosis not present

## 2022-07-16 DIAGNOSIS — Z6841 Body Mass Index (BMI) 40.0 and over, adult: Secondary | ICD-10-CM | POA: Diagnosis not present

## 2022-07-31 DIAGNOSIS — M797 Fibromyalgia: Secondary | ICD-10-CM | POA: Diagnosis not present

## 2022-07-31 DIAGNOSIS — F900 Attention-deficit hyperactivity disorder, predominantly inattentive type: Secondary | ICD-10-CM | POA: Diagnosis not present

## 2022-09-16 DIAGNOSIS — F902 Attention-deficit hyperactivity disorder, combined type: Secondary | ICD-10-CM | POA: Diagnosis not present

## 2022-09-16 DIAGNOSIS — Z2821 Immunization not carried out because of patient refusal: Secondary | ICD-10-CM | POA: Diagnosis not present

## 2022-09-16 DIAGNOSIS — M797 Fibromyalgia: Secondary | ICD-10-CM | POA: Diagnosis not present

## 2022-09-16 DIAGNOSIS — Z6838 Body mass index (BMI) 38.0-38.9, adult: Secondary | ICD-10-CM | POA: Diagnosis not present

## 2022-10-23 DIAGNOSIS — M797 Fibromyalgia: Secondary | ICD-10-CM | POA: Diagnosis not present

## 2022-10-23 DIAGNOSIS — F900 Attention-deficit hyperactivity disorder, predominantly inattentive type: Secondary | ICD-10-CM | POA: Diagnosis not present

## 2023-10-27 ENCOUNTER — Other Ambulatory Visit: Payer: Self-pay

## 2023-10-27 ENCOUNTER — Ambulatory Visit
Admission: RE | Admit: 2023-10-27 | Discharge: 2023-10-27 | Disposition: A | Discharge status: Home / Self Care | Payer: 59 | Source: Ambulatory Visit | Attending: Family Medicine | Admitting: Family Medicine

## 2023-10-27 VITALS — BP 135/91 | HR 79 | Temp 97.8°F | Resp 16

## 2023-10-27 DIAGNOSIS — J069 Acute upper respiratory infection, unspecified: Secondary | ICD-10-CM | POA: Diagnosis not present

## 2023-10-27 DIAGNOSIS — H6592 Unspecified nonsuppurative otitis media, left ear: Secondary | ICD-10-CM

## 2023-10-27 LAB — POCT RAPID STREP A (OFFICE): Rapid Strep A Screen: NEGATIVE

## 2023-10-27 MED ORDER — PREDNISONE 20 MG PO TABS: 40.0000 mg | ORAL_TABLET | Freq: Every day | ORAL | 0 refills | Status: AC

## 2023-10-27 MED ORDER — AMOXICILLIN-POT CLAVULANATE 875-125 MG PO TABS: 1.0000 | ORAL_TABLET | Freq: Two times a day (BID) | ORAL | 0 refills | Status: AC

## 2023-10-27 NOTE — Discharge Instructions (Signed)
Take prednisone once a day for 5 days.  This will help with the congestion and drainage Take the antibiotic 2 times a day for a week.  Take your antibiotic with food Drink lots of water Call or return if not improving in a few days

## 2023-10-27 NOTE — ED Triage Notes (Signed)
Sunday started with sore throat and earache, Monday night it got worse. Monday night had bloody nose. Yesterday started with difficulty swallowing "like razor blades". Has had drainage with odor to it. Some fever. Has tried tylenol, dayquil, ibuprofen.

## 2023-10-27 NOTE — ED Provider Notes (Signed)
[name]    CSN: [id number] Arrival date & time: 10/27/23  7829      History   Chief Complaint Chief Complaint  Patient presents with   Sore Throat    HPI Laurie [name] is a 47 y.o. female.   HPI  Patient states she has a severe sore throat.  It feels like "razor blades".  She is having pain with swallowing.  She has pain in both ears left greater than right.  She states that she has sinus drainage.  The sinus drainage has a foul odor.  She states that she is starting to develop cough.  She is very tired.  Had trouble sleeping last night because of throat pain.  Past Medical History:  Diagnosis Date   Endometriosis    Flying phobia    Genital HSV 10/17/2018   History of colon polyps    History of kidney stones 08/25/2013   Meningitis    spinal   Obesity    Pancreatitis, acute 08/16/2015   Spinal headache 12/02/2011    Patient Active Problem List   Diagnosis Date Noted   Myofascial pain syndrome 11/05/2020   Grieving 11/05/2020   Hypertension goal BP (blood pressure) < 130/80 04/17/2019   Family history of colorectal cancer 04/17/2019   History of cold urticaria 01/31/2019   Allergic urticaria 01/31/2019   Other allergic rhinitis 01/31/2019   History of rheumatoid arthritis 01/31/2019   Morbid obesity with BMI of 40.0-44.9, adult (HCC) 01/31/2019   Anaphylactic reaction due to adverse effect of correct drug or medicament properly administered, initial encounter 01/16/2019   Mixed hyperlipidemia 10/18/2018   Adult ADHD 10/17/2018   History of hysterectomy for benign disease 10/17/2018   Genital HSV 10/17/2018   History of colon polyps    Flying phobia    Right knee pain 01/28/2018   Back pain 05/13/2017   Primary osteoarthritis of both knees 03/11/2015   Poor concentration 10/30/2014   Breast lump 05/29/2014   OSA (obstructive sleep apnea) 02/02/2014   Insulin resistance 11/09/2013   History of kidney stones 08/25/2013   Chronic  constipation 08/04/2013    Past Surgical History:  Procedure Laterality Date   ABDOMINAL HYSTERECTOMY     bilateral tubal      x2   CHOLECYSTECTOMY     ECTOPIC PREGNANCY SURGERY     KNEE ARTHROSCOPY Right    TONSILLECTOMY     TUBAL LIGATION      OB History     Gravida  2   Para  1   Term  0   Preterm  0   AB  1   Living         SAB  0   IAB  0   Ectopic  1   Multiple      Live Births               Home Medications    Prior to Admission medications   Medication Sig Start Date End Date Taking? Authorizing Provider  amphetamine-dextroamphetamine (ADDERALL XR) 10 MG 24 hr capsule Take 10 mg by mouth in the morning, at noon, and at bedtime.   Yes [provider]  tirzepatide (ZEPBOUND) 2.5 MG/0.5ML Pen Inject 2.5 mg into the skin once a week.   Yes [provider]  fexofenadine (ALLEGRA) 180 MG tablet Take 1 tablet (180 mg total) by mouth daily. Patient taking differently: Take 180 mg by mouth daily as needed. 04/17/20   Monica Becton,  MD  valACYclovir (VALTREX) 1000 MG tablet Take 1 tablet (1,000 mg total) by mouth 2 (two) times daily. Patient taking differently: Take 1,000 mg by mouth 2 (two) times daily as needed. 01/30/15   Allie Bossier, MD    Family History Family History  Problem Relation Age of Onset   Thyroid disease Mother    Allergic rhinitis Mother    Cancer Paternal Grandfather        colon   Colon cancer Paternal Grandfather    Cancer Maternal Grandfather        colon   Hyperlipidemia Maternal Grandfather    Hypertension Maternal Grandfather    Colon cancer Maternal Grandfather    Throat cancer Maternal Grandfather    Alcohol abuse Father    Diabetes Father    Hyperlipidemia Maternal Grandmother    Hypertension Maternal Grandmother    Thyroid disease Maternal Grandmother    Diabetes Paternal Grandmother    Asthma Neg Hx    Eczema Neg Hx    Urticaria Neg Hx    Immunodeficiency Neg Hx    Angioedema Neg Hx      Social History Social History   Tobacco Use   Smoking status: Never   Smokeless tobacco: Never  Vaping Use   Vaping status: Never Used  Substance Use Topics   Alcohol use: Never    Comment: occ   Drug use: Never     Allergies   Wellbutrin [bupropion]   Review of Systems Review of Systems See HPI  Physical Exam Triage Vital Signs ED Triage Vitals  Encounter Vitals Group     BP 10/27/23 0936 (!) 135/91     Systolic BP Percentile --      Diastolic BP Percentile --      Pulse Rate 10/27/23 0936 79     Resp 10/27/23 0936 16     Temp 10/27/23 0936 97.8 F (36.6 C)     Temp Source 10/27/23 0936 Oral     SpO2 10/27/23 0936 99 %     Weight --      Height --      Head Circumference --      Peak Flow --      Pain Score 10/27/23 0939 8     Pain Loc --      Pain Education --      Exclude from Growth Chart --    No data found.  Updated Vital Signs BP (!) 135/91   Pulse 79   Temp 97.8 F (36.6 C) (Oral)   Resp 16   SpO2 99%       Physical Exam Constitutional:      General: She is not in acute distress.    Appearance: She is well-developed. She is ill-appearing.  HENT:     Head: Normocephalic and atraumatic.     Right Ear: Tympanic membrane and ear canal normal.     Left Ear: Ear canal normal. A middle ear effusion is present.     Mouth/Throat:     Mouth: Mucous membranes are moist.     Pharynx: Uvula midline. Posterior oropharyngeal erythema present. No pharyngeal swelling.     Tonsils: No tonsillar exudate.  Eyes:     Conjunctiva/sclera: Conjunctivae normal.     Pupils: Pupils are equal, round, and reactive to light.  Cardiovascular:     Rate and Rhythm: Normal rate.  Pulmonary:     Effort: Pulmonary effort is normal. No respiratory distress.  Abdominal:     General:  There is no distension.     Palpations: Abdomen is soft.  Musculoskeletal:        General: Normal range of motion.     Cervical back: Normal range of motion.  Lymphadenopathy:      Cervical: Cervical adenopathy present.  Skin:    General: Skin is warm and dry.  Neurological:     Mental Status: She is alert.      UC Treatments / Results  Labs (all labs ordered are listed, but only abnormal results are displayed) Labs Reviewed  CULTURE, GROUP A STREP [facility])  POCT RAPID STREP A (OFFICE)    EKG   Radiology No results found.  Procedures Procedures (including critical care time)  Medications Ordered in UC Medications - No data to display  Initial Impression / Assessment and Plan / UC Course  I have reviewed the triage vital signs and the nursing notes.  Pertinent labs & imaging results that were available during my care of the patient were reviewed by me and considered in my medical decision making (see chart for details).     Final Clinical Impressions(s) / UC Diagnoses   Final diagnoses:  OME (otitis media with effusion), left  Viral upper respiratory tract infection     Discharge Instructions      Take prednisone once a day for 5 days.  This will help with the congestion and drainage Take the antibiotic 2 times a day for a week.  Take your antibiotic with food Drink lots of water Call or return if not improving in a few days    ED Prescriptions     Medication Sig Dispense Auth. Provider   predniSONE (DELTASONE) 20 MG tablet Take 2 tablets (40 mg total) by mouth daily with breakfast. 10 tablet Eustace Moore, MD   amoxicillin-clavulanate (AUGMENTIN) 875-125 MG tablet Take 1 tablet by mouth every 12 (twelve) hours. 14 tablet Eustace Moore, MD      PDMP not reviewed this encounter.   Eustace Moore, MD 10/27/23 1055

## 2023-10-30 LAB — CULTURE, GROUP A STREP (THRC)
